# Patient Record
Sex: Male | Born: 1955 | Race: White | Hispanic: No | Marital: Single | Smoking: Current every day smoker
Health system: Southern US, Community
[De-identification: ages and names within clinical notes are randomized; demographics above are authoritative.]

## PROBLEM LIST (undated history)

## (undated) DIAGNOSIS — M1990 Unspecified osteoarthritis, unspecified site: Secondary | ICD-10-CM

## (undated) DIAGNOSIS — Z9989 Dependence on other enabling machines and devices: Secondary | ICD-10-CM

## (undated) DIAGNOSIS — G4733 Obstructive sleep apnea (adult) (pediatric): Secondary | ICD-10-CM

## (undated) DIAGNOSIS — M545 Low back pain, unspecified: Secondary | ICD-10-CM

## (undated) DIAGNOSIS — K219 Gastro-esophageal reflux disease without esophagitis: Secondary | ICD-10-CM

## (undated) DIAGNOSIS — J439 Emphysema, unspecified: Secondary | ICD-10-CM

## (undated) DIAGNOSIS — M79604 Pain in right leg: Secondary | ICD-10-CM

## (undated) DIAGNOSIS — I639 Cerebral infarction, unspecified: Secondary | ICD-10-CM

## (undated) DIAGNOSIS — I10 Essential (primary) hypertension: Secondary | ICD-10-CM

## (undated) DIAGNOSIS — F419 Anxiety disorder, unspecified: Secondary | ICD-10-CM

## (undated) DIAGNOSIS — H3530 Unspecified macular degeneration: Secondary | ICD-10-CM

## (undated) DIAGNOSIS — E1142 Type 2 diabetes mellitus with diabetic polyneuropathy: Secondary | ICD-10-CM

## (undated) DIAGNOSIS — E119 Type 2 diabetes mellitus without complications: Secondary | ICD-10-CM

## (undated) DIAGNOSIS — R519 Headache, unspecified: Secondary | ICD-10-CM

## (undated) DIAGNOSIS — R51 Headache: Secondary | ICD-10-CM

## (undated) DIAGNOSIS — M79605 Pain in left leg: Secondary | ICD-10-CM

## (undated) DIAGNOSIS — I219 Acute myocardial infarction, unspecified: Secondary | ICD-10-CM

## (undated) DIAGNOSIS — C61 Malignant neoplasm of prostate: Secondary | ICD-10-CM

## (undated) DIAGNOSIS — E7800 Pure hypercholesterolemia, unspecified: Secondary | ICD-10-CM

## (undated) DIAGNOSIS — I2510 Atherosclerotic heart disease of native coronary artery without angina pectoris: Secondary | ICD-10-CM

## (undated) DIAGNOSIS — J449 Chronic obstructive pulmonary disease, unspecified: Secondary | ICD-10-CM

## (undated) DIAGNOSIS — G8929 Other chronic pain: Secondary | ICD-10-CM

## (undated) HISTORY — PX: WISDOM TOOTH EXTRACTION: SHX21

## (undated) HISTORY — PX: FRACTURE SURGERY: SHX138

---
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
## 2009-09-27 DIAGNOSIS — I639 Cerebral infarction, unspecified: Secondary | ICD-10-CM

## 2009-09-27 HISTORY — DX: Cerebral infarction, unspecified: I63.9

## 2009-09-30 DIAGNOSIS — Z8673 Personal history of transient ischemic attack (TIA), and cerebral infarction without residual deficits: Secondary | ICD-10-CM | POA: Insufficient documentation

## 2012-03-20 ENCOUNTER — Emergency Department (HOSPITAL_COMMUNITY)
Admission: EM | Admit: 2012-03-20 | Discharge: 2012-03-21 | Disposition: A | Discharge status: Home / Self Care | Payer: No Typology Code available for payment source | Attending: Emergency Medicine | Admitting: Emergency Medicine

## 2012-03-20 ENCOUNTER — Emergency Department (HOSPITAL_COMMUNITY): Payer: No Typology Code available for payment source

## 2012-03-20 ENCOUNTER — Encounter (HOSPITAL_COMMUNITY): Payer: Self-pay | Admitting: *Deleted

## 2012-03-20 DIAGNOSIS — S8263XA Displaced fracture of lateral malleolus of unspecified fibula, initial encounter for closed fracture: Secondary | ICD-10-CM | POA: Insufficient documentation

## 2012-03-20 DIAGNOSIS — E119 Type 2 diabetes mellitus without complications: Secondary | ICD-10-CM | POA: Insufficient documentation

## 2012-03-20 DIAGNOSIS — Y929 Unspecified place or not applicable: Secondary | ICD-10-CM | POA: Insufficient documentation

## 2012-03-20 DIAGNOSIS — G8929 Other chronic pain: Secondary | ICD-10-CM | POA: Insufficient documentation

## 2012-03-20 DIAGNOSIS — Y9389 Activity, other specified: Secondary | ICD-10-CM | POA: Insufficient documentation

## 2012-03-20 DIAGNOSIS — F17200 Nicotine dependence, unspecified, uncomplicated: Secondary | ICD-10-CM | POA: Insufficient documentation

## 2012-03-20 DIAGNOSIS — G4730 Sleep apnea, unspecified: Secondary | ICD-10-CM | POA: Insufficient documentation

## 2012-03-20 DIAGNOSIS — Z8673 Personal history of transient ischemic attack (TIA), and cerebral infarction without residual deficits: Secondary | ICD-10-CM | POA: Insufficient documentation

## 2012-03-20 DIAGNOSIS — J4489 Other specified chronic obstructive pulmonary disease: Secondary | ICD-10-CM | POA: Insufficient documentation

## 2012-03-20 DIAGNOSIS — S52509A Unspecified fracture of the lower end of unspecified radius, initial encounter for closed fracture: Secondary | ICD-10-CM

## 2012-03-20 DIAGNOSIS — J449 Chronic obstructive pulmonary disease, unspecified: Secondary | ICD-10-CM | POA: Insufficient documentation

## 2012-03-20 DIAGNOSIS — M549 Dorsalgia, unspecified: Secondary | ICD-10-CM | POA: Insufficient documentation

## 2012-03-20 DIAGNOSIS — Z79899 Other long term (current) drug therapy: Secondary | ICD-10-CM | POA: Insufficient documentation

## 2012-03-20 DIAGNOSIS — S52599A Other fractures of lower end of unspecified radius, initial encounter for closed fracture: Secondary | ICD-10-CM | POA: Insufficient documentation

## 2012-03-20 HISTORY — DX: Cerebral infarction, unspecified: I63.9

## 2012-03-20 HISTORY — DX: Chronic obstructive pulmonary disease, unspecified: J44.9

## 2012-03-20 LAB — POCT I-STAT, CHEM 8
Calcium, Ion: 1.12 mmol/L (ref 1.12–1.23)
Glucose, Bld: 257 mg/dL — ABNORMAL HIGH (ref 70–99)
HCT: 43 % (ref 39.0–52.0)
Hemoglobin: 14.6 g/dL (ref 13.0–17.0)
TCO2: 19 mmol/L (ref 0–100)

## 2012-03-20 MED ORDER — IOHEXOL 300 MG/ML  SOLN
100.0000 mL | Freq: Once | INTRAMUSCULAR | Status: AC | PRN
  Administered 2012-03-20: 100 mL via INTRAVENOUS

## 2012-03-20 MED ORDER — FENTANYL CITRATE 0.05 MG/ML IJ SOLN
50.0000 ug | INTRAMUSCULAR | Status: AC | PRN
  Administered 2012-03-20 (×2): 50 ug via INTRAVENOUS
  Filled 2012-03-20 (×2): qty 2

## 2012-03-20 MED ORDER — OXYCODONE HCL 5 MG PO TABS: 5.0000 mg | ORAL_TABLET | ORAL | Status: DC | PRN

## 2012-03-20 MED ORDER — SODIUM CHLORIDE 0.9 % IV SOLN
INTRAVENOUS | Status: DC
  Administered 2012-03-20: 21:00:00 via INTRAVENOUS
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
## 2012-03-21 NOTE — ED Notes (Signed)
Pt discharged. Pt stable at time of discharge. Medications reviewed pt has no questions regarding discharge at this time. Pt voiced understanding of discharge instructions.  

## 2012-03-24 ENCOUNTER — Other Ambulatory Visit (HOSPITAL_COMMUNITY): Payer: Self-pay | Admitting: Orthopaedic Surgery

## 2012-03-24 NOTE — Patient Instructions (Addendum)
Eric Brooks  03/24/2012   Your procedure is scheduled on:   03/28/2012  Report to [facility] at  1030  AM.  Call this number if you have problems the morning of surgery: [phone]   Remember:   Do not eat food:After Midnight.  May have clear liquids:until Midnight .    Take these medicines the morning of surgery with A SIP OF WATER:  lyrica,oxycodone   Do not wear jewelry, make-up or nail polish.  Do not wear lotions, powders, or perfumes.  Do not shave 48 hours prior to surgery. Men may shave face and neck.  Do not bring valuables to the hospital.  Contacts, dentures or bridgework may not be worn into surgery.  Leave suitcase in the car. After surgery it may be brought to your room.  For patients admitted to the hospital, checkout time is 11:00 AM the day of discharge.   Patients discharged the day of surgery will not be allowed to drive home.  Name and phone number of your driver: family  Special Instructions: Shower using CHG 2 nights before surgery and the night before surgery.  If you shower the day of surgery use CHG.  Use special wash - you have one bottle of CHG for all showers.  You should use approximately 1/3 of the bottle for each shower.   Please read over the following fact sheets that you were given: Pain Booklet, Coughing and Deep Breathing, MRSA Information, Surgical Site Infection Prevention, Anesthesia Post-op Instructions and Care and Recovery After Surgery Radial Fracture You have a broken bone (fracture) of the forearm. This is the part of your arm between the elbow and your wrist. Your forearm is made up of two bones. These are the radius and ulna. Your fracture is in the radial shaft. This is the bone in your forearm located on the thumb side. A cast or splint is used to protect and keep your injured bone from moving. The cast or splint will be on generally for about 5 to 6 weeks, with individual variations. HOME CARE INSTRUCTIONS   Keep the injured  part elevated while sitting or lying down. Keep the injury above the level of your heart (the center of the chest). This will decrease swelling and pain.  Apply ice to the injury for 15 to Eric minutes, 3 to 4 times per day while awake, for 2 days. Put the ice in a plastic bag and place a towel between the bag of ice and your cast or splint.  Move your fingers to avoid stiffness and minimize swelling.  If you have a plaster or fiberglass cast:  Do not try to scratch the skin under the cast using sharp or pointed objects.  Check the skin around the cast every day. You may put lotion on any red or sore areas.  Keep your cast dry and clean.  If you have a plaster splint:  Wear the splint as directed.  You may loosen the elastic around the splint if your fingers become numb, tingle, or turn cold or blue.  Do not put pressure on any part of your cast or splint. It may break. Rest your cast only on a pillow for the first 24 hours until it is fully hardened.  Your cast or splint can be protected during bathing with a plastic bag. Do not lower the cast or splint into water.  Only take over-the-counter or prescription medicines for pain, discomfort, or fever as directed by  your caregiver. SEEK IMMEDIATE MEDICAL CARE IF:   Your cast gets damaged or breaks.  You have more severe pain or swelling than you did before getting the cast.  You have severe pain when stretching your fingers.  There is a bad smell, new stains and/or pus-like (purulent) drainage coming from under the cast.  Your fingers or hand turn pale or blue and become cold or your loose feeling. Document Released: 10/15/2005 Document Revised: 07/27/2011 Document Reviewed: 01/11/2006 [facility] Patient Information 2013 [place], [place]. PATIENT INSTRUCTIONS POST-ANESTHESIA  IMMEDIATELY FOLLOWING SURGERY:  Do not drive or operate machinery for the first twenty four hours after surgery.  Do not make any important decisions for  twenty four hours after surgery or while taking narcotic pain medications or sedatives.  If you develop intractable nausea and vomiting or a severe headache please notify your doctor immediately.  FOLLOW-UP:  Please make an appointment with your surgeon as instructed. You do not need to follow up with anesthesia unless specifically instructed to do so.  WOUND CARE INSTRUCTIONS (if applicable):  Keep a dry clean dressing on the anesthesia/puncture wound site if there is drainage.  Once the wound has quit draining you may leave it open to air.  Generally you should leave the bandage intact for twenty four hours unless there is drainage.  If the epidural site drains for more than 36-48 hours please call the anesthesia department.  QUESTIONS?:  Please feel free to call your physician or the hospital operator if you have any questions, and they will be happy to assist you.

## 2012-03-25 ENCOUNTER — Encounter (HOSPITAL_COMMUNITY)
Admission: RE | Admit: 2012-03-25 | Discharge: 2012-03-25 | Disposition: A | Discharge status: Home / Self Care | Payer: No Typology Code available for payment source | Source: Ambulatory Visit | Attending: Orthopaedic Surgery | Admitting: Orthopaedic Surgery

## 2012-03-25 ENCOUNTER — Encounter (HOSPITAL_COMMUNITY): Payer: Self-pay

## 2012-03-25 ENCOUNTER — Encounter (HOSPITAL_COMMUNITY): Payer: Self-pay | Admitting: Pharmacy Technician

## 2012-03-25 HISTORY — DX: Anxiety disorder, unspecified: F41.9

## 2012-03-25 HISTORY — DX: Pure hypercholesterolemia, unspecified: E78.00

## 2012-03-25 HISTORY — DX: Gastro-esophageal reflux disease without esophagitis: K21.9

## 2012-03-25 HISTORY — DX: Unspecified osteoarthritis, unspecified site: M19.90

## 2012-03-25 LAB — COMPREHENSIVE METABOLIC PANEL
AST: 16 U/L (ref 0–37)
Albumin: 3.6 g/dL (ref 3.5–5.2)
BUN: 13 mg/dL (ref 6–23)
Calcium: 9.2 mg/dL (ref 8.4–10.5)
Creatinine, Ser: 0.67 mg/dL (ref 0.50–1.35)
Total Protein: 7 g/dL (ref 6.0–8.3)

## 2012-03-25 LAB — URINALYSIS, ROUTINE W REFLEX MICROSCOPIC
Glucose, UA: >1000 mg/dL — AB
Hgb urine dipstick: NEGATIVE
Leukocytes, UA: NEGATIVE
Specific Gravity, Urine: >1.03 — ABNORMAL HIGH (ref 1.005–1.030)

## 2012-03-25 LAB — SURGICAL PCR SCREEN
MRSA, PCR: NEGATIVE
Staphylococcus aureus: NEGATIVE

## 2012-03-25 LAB — CBC WITH DIFFERENTIAL/PLATELET
Basophils Absolute: 0.1 10*3/uL (ref 0.0–0.1)
Basophils Relative: 1 % (ref 0–1)
Eosinophils Absolute: 0.3 10*3/uL (ref 0.0–0.7)
Eosinophils Relative: 3 % (ref 0–5)
HCT: 45 % (ref 39.0–52.0)
MCH: 31.1 pg (ref 26.0–34.0)
MCHC: 35.3 g/dL (ref 30.0–36.0)
MCV: 87.9 fL (ref 78.0–100.0)
Monocytes Absolute: 0.8 10*3/uL (ref 0.1–1.0)
Neutro Abs: 7.5 10*3/uL (ref 1.7–7.7)
RDW: 12.2 % (ref 11.5–15.5)

## 2012-03-25 LAB — URINE MICROSCOPIC-ADD ON: Urine-Other: NONE SEEN

## 2012-03-25 NOTE — Pre-Procedure Instructions (Signed)
Patient in for preop visit. States that after 5-6 hours of being NPO he typically will have a blood sugar of 70 or below. Dr Jayme Cloud notified and per his order patient was instructed that if he had an episode of hypoglycemia prior to surgery that he could drink some sort of clear juice,with no pulp like apple juice. Patient verbalized understanding of this also. Dr Jayme Cloud also wanted patient to come in earlier than regular arrival time so we could check his CBG. Patient also verbalized understanding of this.

## 2012-03-28 ENCOUNTER — Encounter (HOSPITAL_COMMUNITY): Payer: Self-pay | Admitting: Anesthesiology

## 2012-03-28 ENCOUNTER — Other Ambulatory Visit (HOSPITAL_COMMUNITY): Payer: Self-pay | Admitting: Orthopaedic Surgery

## 2012-03-28 ENCOUNTER — Ambulatory Visit (HOSPITAL_COMMUNITY): Payer: No Typology Code available for payment source | Admitting: Anesthesiology

## 2012-03-28 ENCOUNTER — Encounter (HOSPITAL_COMMUNITY)
Admission: RE | Disposition: A | Discharge status: Home / Self Care | Payer: Self-pay | Source: Ambulatory Visit | Attending: Orthopaedic Surgery

## 2012-03-28 ENCOUNTER — Encounter (HOSPITAL_COMMUNITY): Payer: Self-pay | Admitting: Orthopaedic Surgery

## 2012-03-28 ENCOUNTER — Ambulatory Visit (HOSPITAL_COMMUNITY)
Admission: RE | Admit: 2012-03-28 | Discharge: 2012-03-29 | Disposition: A | Discharge status: Home / Self Care | Payer: No Typology Code available for payment source | Source: Ambulatory Visit | Attending: Orthopaedic Surgery | Admitting: Orthopaedic Surgery

## 2012-03-28 ENCOUNTER — Ambulatory Visit (HOSPITAL_COMMUNITY): Payer: No Typology Code available for payment source

## 2012-03-28 DIAGNOSIS — J4489 Other specified chronic obstructive pulmonary disease: Secondary | ICD-10-CM | POA: Insufficient documentation

## 2012-03-28 DIAGNOSIS — Z01812 Encounter for preprocedural laboratory examination: Secondary | ICD-10-CM | POA: Insufficient documentation

## 2012-03-28 DIAGNOSIS — J449 Chronic obstructive pulmonary disease, unspecified: Secondary | ICD-10-CM | POA: Insufficient documentation

## 2012-03-28 DIAGNOSIS — S52539A Colles' fracture of unspecified radius, initial encounter for closed fracture: Secondary | ICD-10-CM | POA: Insufficient documentation

## 2012-03-28 DIAGNOSIS — S52309A Unspecified fracture of shaft of unspecified radius, initial encounter for closed fracture: Secondary | ICD-10-CM

## 2012-03-28 DIAGNOSIS — E119 Type 2 diabetes mellitus without complications: Secondary | ICD-10-CM | POA: Insufficient documentation

## 2012-03-28 HISTORY — PX: ORIF RADIAL FRACTURE: SHX5113

## 2012-03-28 LAB — GLUCOSE, CAPILLARY: Glucose-Capillary: 148 mg/dL — ABNORMAL HIGH (ref 70–99)

## 2012-03-28 SURGERY — OPEN REDUCTION INTERNAL FIXATION (ORIF) RADIAL FRACTURE
Anesthesia: General | Site: Arm Lower | Laterality: Right | Wound class: Clean

## 2012-03-28 MED ORDER — MIDAZOLAM HCL 5 MG/5ML IJ SOLN
INTRAMUSCULAR | Status: DC | PRN
  Administered 2012-03-28: 2 mg via INTRAVENOUS

## 2012-03-28 MED ORDER — SUCCINYLCHOLINE CHLORIDE 20 MG/ML IJ SOLN
INTRAMUSCULAR | Status: AC
  Filled 2012-03-28: qty 1

## 2012-03-28 MED ORDER — PREGABALIN 75 MG PO CAPS
75.0000 mg | ORAL_CAPSULE | Freq: Three times a day (TID) | ORAL | Status: DC
  Administered 2012-03-28 – 2012-03-29 (×3): 75 mg via ORAL
  Filled 2012-03-28 (×3): qty 1

## 2012-03-28 MED ORDER — FENTANYL CITRATE 0.05 MG/ML IJ SOLN
25.0000 ug | INTRAMUSCULAR | Status: DC | PRN
  Administered 2012-03-28 (×4): 50 ug via INTRAVENOUS

## 2012-03-28 MED ORDER — METFORMIN HCL 500 MG PO TABS
500.0000 mg | ORAL_TABLET | Freq: Two times a day (BID) | ORAL | Status: DC
  Administered 2012-03-28 – 2012-03-29 (×2): 500 mg via ORAL
  Filled 2012-03-28 (×2): qty 1

## 2012-03-28 MED ORDER — FENTANYL CITRATE 0.05 MG/ML IJ SOLN
INTRAMUSCULAR | Status: AC
  Filled 2012-03-28: qty 2

## 2012-03-28 MED ORDER — CEFAZOLIN SODIUM-DEXTROSE 2-3 GM-% IV SOLR
INTRAVENOUS | Status: AC
  Filled 2012-03-28: qty 50

## 2012-03-28 MED ORDER — PROPOFOL 10 MG/ML IV EMUL
INTRAVENOUS | Status: DC | PRN
  Administered 2012-03-28: 200 mg via INTRAVENOUS

## 2012-03-28 MED ORDER — SODIUM CHLORIDE 0.9 % IJ SOLN: 9.0000 mL | INTRAMUSCULAR | Status: DC | PRN

## 2012-03-28 MED ORDER — LACTATED RINGERS IV SOLN
INTRAVENOUS | Status: DC | PRN
  Administered 2012-03-28 (×2): via INTRAVENOUS

## 2012-03-28 MED ORDER — METOPROLOL TARTRATE 1 MG/ML IV SOLN
INTRAVENOUS | Status: DC | PRN
  Administered 2012-03-28 (×5): 1 mg via INTRAVENOUS

## 2012-03-28 MED ORDER — MORPHINE SULFATE (PF) 1 MG/ML IV SOLN
INTRAVENOUS | Status: DC
  Administered 2012-03-28: 19:00:00 via INTRAVENOUS
  Administered 2012-03-28: 18.55 mg via INTRAVENOUS
  Administered 2012-03-28 (×2): via INTRAVENOUS
  Administered 2012-03-29: 22.5 mg via INTRAVENOUS
  Administered 2012-03-29: 03:00:00 via INTRAVENOUS
  Filled 2012-03-28 (×4): qty 25

## 2012-03-28 MED ORDER — PROPOFOL 10 MG/ML IV EMUL
INTRAVENOUS | Status: AC
  Filled 2012-03-28: qty 20

## 2012-03-28 MED ORDER — NALOXONE HCL 0.4 MG/ML IJ SOLN: 0.4000 mg | INTRAMUSCULAR | Status: DC | PRN

## 2012-03-28 MED ORDER — ONDANSETRON HCL 4 MG/2ML IJ SOLN
4.0000 mg | Freq: Once | INTRAMUSCULAR | Status: AC
  Administered 2012-03-28: 4 mg via INTRAVENOUS

## 2012-03-28 MED ORDER — ALBUTEROL SULFATE (5 MG/ML) 0.5% IN NEBU
2.5000 mg | INHALATION_SOLUTION | Freq: Four times a day (QID) | RESPIRATORY_TRACT | Status: DC
  Administered 2012-03-28 – 2012-03-29 (×3): 2.5 mg via RESPIRATORY_TRACT
  Filled 2012-03-28 (×3): qty 0.5

## 2012-03-28 MED ORDER — OXYCODONE HCL 5 MG PO TABS: 5.0000 mg | ORAL_TABLET | ORAL | Status: DC | PRN

## 2012-03-28 MED ORDER — NEOSTIGMINE METHYLSULFATE 1 MG/ML IJ SOLN
INTRAMUSCULAR | Status: AC
  Filled 2012-03-28: qty 10

## 2012-03-28 MED ORDER — NEOSTIGMINE METHYLSULFATE 1 MG/ML IJ SOLN
INTRAMUSCULAR | Status: DC | PRN
  Administered 2012-03-28: 4 mg via INTRAVENOUS

## 2012-03-28 MED ORDER — ONDANSETRON HCL 4 MG/2ML IJ SOLN: 4.0000 mg | Freq: Once | INTRAMUSCULAR | Status: DC | PRN

## 2012-03-28 MED ORDER — LACTATED RINGERS IV SOLN
INTRAVENOUS | Status: DC
  Administered 2012-03-28: 12:00:00 via INTRAVENOUS

## 2012-03-28 MED ORDER — GLYCOPYRROLATE 0.2 MG/ML IJ SOLN
INTRAMUSCULAR | Status: AC
  Filled 2012-03-28: qty 3

## 2012-03-28 MED ORDER — MIDAZOLAM HCL 2 MG/2ML IJ SOLN
1.0000 mg | INTRAMUSCULAR | Status: DC | PRN
  Administered 2012-03-28: 2 mg via INTRAVENOUS

## 2012-03-28 MED ORDER — DIPHENHYDRAMINE HCL 50 MG/ML IJ SOLN: 12.5000 mg | Freq: Four times a day (QID) | INTRAMUSCULAR | Status: DC | PRN

## 2012-03-28 MED ORDER — TIOTROPIUM BROMIDE MONOHYDRATE 18 MCG IN CAPS
18.0000 ug | ORAL_CAPSULE | Freq: Every day | RESPIRATORY_TRACT | Status: DC
  Administered 2012-03-29: 18 ug via RESPIRATORY_TRACT
  Filled 2012-03-28: qty 5

## 2012-03-28 MED ORDER — FENTANYL CITRATE 0.05 MG/ML IJ SOLN
INTRAMUSCULAR | Status: AC
  Filled 2012-03-28: qty 4

## 2012-03-28 MED ORDER — LIDOCAINE HCL (CARDIAC) 10 MG/ML IV SOLN
INTRAVENOUS | Status: DC | PRN
  Administered 2012-03-28: 50 mg via INTRAVENOUS

## 2012-03-28 MED ORDER — DIPHENHYDRAMINE HCL 12.5 MG/5ML PO ELIX: 12.5000 mg | ORAL_SOLUTION | Freq: Four times a day (QID) | ORAL | Status: DC | PRN

## 2012-03-28 MED ORDER — ROCURONIUM BROMIDE 50 MG/5ML IV SOLN
INTRAVENOUS | Status: AC
  Filled 2012-03-28: qty 1

## 2012-03-28 MED ORDER — MIDAZOLAM HCL 2 MG/2ML IJ SOLN
INTRAMUSCULAR | Status: AC
  Filled 2012-03-28: qty 2

## 2012-03-28 MED ORDER — 0.9 % SODIUM CHLORIDE (POUR BTL) OPTIME
TOPICAL | Status: DC | PRN
  Administered 2012-03-28: 1000 mL

## 2012-03-28 MED ORDER — ONDANSETRON HCL 4 MG/2ML IJ SOLN: 4.0000 mg | Freq: Four times a day (QID) | INTRAMUSCULAR | Status: DC | PRN

## 2012-03-28 MED ORDER — GLYCOPYRROLATE 0.2 MG/ML IJ SOLN
INTRAMUSCULAR | Status: DC | PRN
  Administered 2012-03-28: 0.6 mg via INTRAVENOUS

## 2012-03-28 MED ORDER — ONDANSETRON HCL 4 MG/2ML IJ SOLN
INTRAMUSCULAR | Status: AC
  Filled 2012-03-28: qty 2

## 2012-03-28 MED ORDER — SIMVASTATIN 20 MG PO TABS
20.0000 mg | ORAL_TABLET | Freq: Every evening | ORAL | Status: DC
  Administered 2012-03-28: 20 mg via ORAL
  Filled 2012-03-28: qty 1

## 2012-03-28 MED ORDER — SUCCINYLCHOLINE CHLORIDE 20 MG/ML IJ SOLN
INTRAMUSCULAR | Status: DC | PRN
  Administered 2012-03-28: 120 mg via INTRAVENOUS

## 2012-03-28 MED ORDER — CEFAZOLIN SODIUM-DEXTROSE 2-3 GM-% IV SOLR: 2.0000 g | INTRAVENOUS | Status: DC

## 2012-03-28 MED ORDER — CEFAZOLIN SODIUM-DEXTROSE 2-3 GM-% IV SOLR
INTRAVENOUS | Status: DC | PRN
  Administered 2012-03-28: 2 g via INTRAVENOUS

## 2012-03-28 MED ORDER — GLYCOPYRROLATE 0.2 MG/ML IJ SOLN
INTRAMUSCULAR | Status: AC
  Filled 2012-03-28: qty 1

## 2012-03-28 MED ORDER — FENTANYL CITRATE 0.05 MG/ML IJ SOLN
INTRAMUSCULAR | Status: DC | PRN
  Administered 2012-03-28 (×2): 50 ug via INTRAVENOUS
  Administered 2012-03-28 (×2): 100 ug via INTRAVENOUS
  Administered 2012-03-28 (×2): 50 ug via INTRAVENOUS

## 2012-03-28 MED ORDER — ROCURONIUM BROMIDE 100 MG/10ML IV SOLN
INTRAVENOUS | Status: DC | PRN
  Administered 2012-03-28 (×2): 10 mg via INTRAVENOUS

## 2012-03-28 MED ORDER — GLYCOPYRROLATE 0.2 MG/ML IJ SOLN
0.2000 mg | Freq: Once | INTRAMUSCULAR | Status: AC
  Administered 2012-03-28: 0.2 mg via INTRAVENOUS

## 2012-03-28 MED ORDER — SODIUM CHLORIDE 0.45 % IV SOLN
INTRAVENOUS | Status: DC
  Administered 2012-03-28: 16:00:00 via INTRAVENOUS

## 2012-03-28 MED ORDER — LIDOCAINE HCL (PF) 1 % IJ SOLN
INTRAMUSCULAR | Status: AC
  Filled 2012-03-28: qty 5

## 2012-03-28 MED ORDER — PROMETHAZINE HCL 25 MG/ML IJ SOLN: 12.5000 mg | INTRAMUSCULAR | Status: DC | PRN

## 2012-03-28 MED ORDER — METOPROLOL TARTRATE 1 MG/ML IV SOLN
INTRAVENOUS | Status: AC
  Filled 2012-03-28: qty 5

## 2012-03-28 SURGICAL SUPPLY — 51 items
BAG HAMPER (MISCELLANEOUS) ×2 IMPLANT
BANDAGE ELASTIC 4 VELCRO NS (GAUZE/BANDAGES/DRESSINGS) ×4 IMPLANT
BANDAGE ESMARK 4X12 BL STRL LF (DISPOSABLE) ×1 IMPLANT
BIT DRILL 2.5X110 QC LCP DISP (BIT) ×2 IMPLANT
BNDG COHESIVE 4X5 TAN NS LF (GAUZE/BANDAGES/DRESSINGS) ×2 IMPLANT
BNDG ESMARK 4X12 BLUE STRL LF (DISPOSABLE) ×2
CLOTH BEACON ORANGE TIMEOUT ST (SAFETY) ×2 IMPLANT
COVER LIGHT HANDLE STERIS (MISCELLANEOUS) ×4 IMPLANT
CUFF TOURNIQUET SINGLE 18IN (TOURNIQUET CUFF) IMPLANT
CUFF TOURNIQUET SINGLE 24IN (TOURNIQUET CUFF) ×2 IMPLANT
DRAPE C-ARM FOLDED MOBILE STRL (DRAPES) ×2 IMPLANT
DURAPREP 26ML APPLICATOR (WOUND CARE) ×4 IMPLANT
GAUZE XEROFORM 5X9 LF (GAUZE/BANDAGES/DRESSINGS) ×2 IMPLANT
GLOVE BIO SURGEON STRL SZ8 (GLOVE) ×2 IMPLANT
GLOVE BIO SURGEON STRL SZ8.5 (GLOVE) ×2 IMPLANT
GLOVE BIOGEL PI IND STRL 7.0 (GLOVE) ×2 IMPLANT
GLOVE BIOGEL PI INDICATOR 7.0 (GLOVE) ×2
GLOVE ECLIPSE 6.5 STRL STRAW (GLOVE) ×4 IMPLANT
GLOVE EXAM NITRILE MD LF STRL (GLOVE) ×2 IMPLANT
GOWN STRL REIN XL XLG (GOWN DISPOSABLE) ×6 IMPLANT
GUIDEWIRE ORTH 6X062XTROC NS (WIRE) IMPLANT
K-WIRE .062 (WIRE)
K-WIRE 5 THRD TROCAR 1.6X150 (WIRE) ×2
KIT ROOM TURNOVER APOR (KITS) ×2 IMPLANT
KWIRE 5 THRD TROCAR 1.6X150 (WIRE) ×1 IMPLANT
MANIFOLD NEPTUNE II (INSTRUMENTS) ×2 IMPLANT
NS IRRIG 1000ML POUR BTL (IV SOLUTION) ×2 IMPLANT
PACK BASIC LIMB (CUSTOM PROCEDURE TRAY) ×2 IMPLANT
PAD ABD 5X9 TENDERSORB (GAUZE/BANDAGES/DRESSINGS) ×4 IMPLANT
PAD CAST 4YDX4 CTTN HI CHSV (CAST SUPPLIES) ×1 IMPLANT
PADDING CAST COTTON 4X4 STRL (CAST SUPPLIES) ×1
PROS LCP PLATE 5H 72MM (Plate) ×2 IMPLANT
PROSTHESIS LCP PLATE 5H 72MM (Plate) ×1 IMPLANT
PUTTY DBX 1CC (Putty) ×2 IMPLANT
PUTTY DBX 1CC DEPUY (Putty) ×1 IMPLANT
SCREW CORTEX 3.5 18MM (Screw) ×4 IMPLANT
SCREW CORTEX 3.5 20MM (Screw) ×1 IMPLANT
SCREW LOCK CORT ST 3.5X18 (Screw) ×4 IMPLANT
SCREW LOCK CORT ST 3.5X20 (Screw) ×1 IMPLANT
SET BASIN LINEN APH (SET/KITS/TRAYS/PACK) ×2 IMPLANT
SOL PREP PROV IODINE SCRUB 4OZ (MISCELLANEOUS) IMPLANT
SPLINT IMMOBILIZER J 3INX20FT (CAST SUPPLIES) ×2
SPLINT J IMMOBILIZER 3X20FT (CAST SUPPLIES) ×2 IMPLANT
SPONGE GAUZE 4X4 12PLY (GAUZE/BANDAGES/DRESSINGS) ×2 IMPLANT
SPONGE LAP 18X18 X RAY DECT (DISPOSABLE) ×2 IMPLANT
STAPLER VISISTAT 35W (STAPLE) ×2 IMPLANT
SUT CHROMIC 2 0 CT 1 (SUTURE) ×2 IMPLANT
SUT ETHILON 3 0 FSL (SUTURE) IMPLANT
SUT PLAIN CT 1/2CIR 2-0 27IN (SUTURE) ×2 IMPLANT
SYR BULB IRRIGATION 50ML (SYRINGE) ×2 IMPLANT
TOWEL OR 17X26 4PK STRL BLUE (TOWEL DISPOSABLE) ×2 IMPLANT

## 2012-03-28 NOTE — Anesthesia Preprocedure Evaluation (Signed)
Anesthesia Evaluation  Patient identified by MRN, date of birth, ID band Patient awake    Reviewed: Allergy & Precautions, H&P , NPO status , Patient's Chart, lab work & pertinent test results  Airway Mallampati: III TM Distance: >3 FB     Dental  (+) Teeth Intact and Partial Upper   Pulmonary sleep apnea and Continuous Positive Airway Pressure Ventilation , COPDCurrent Smoker,  breath sounds clear to auscultation        Cardiovascular negative cardio ROS  Rhythm:Regular Rate:Normal     Neuro/Psych Anxiety CVA, Residual Symptoms    GI/Hepatic GERD-  Medicated and Controlled,  Endo/Other  diabetes, Well Controlled, Type 2, Oral Hypoglycemic AgentsMorbid obesity  Renal/GU      Musculoskeletal   Abdominal   Peds  Hematology   Anesthesia Other Findings   Reproductive/Obstetrics                           Anesthesia Physical Anesthesia Plan  ASA: III  Anesthesia Plan: General   Post-op Pain Management:    Induction: Intravenous, Rapid sequence and Cricoid pressure planned  Airway Management Planned: Oral ETT  Additional Equipment:   Intra-op Plan:   Post-operative Plan: Extubation in OR  Informed Consent: I have reviewed the patients History and Physical, chart, labs and discussed the procedure including the risks, benefits and alternatives for the proposed anesthesia with the patient or authorized representative who has indicated his/her understanding and acceptance.     Plan Discussed with:   Anesthesia Plan Comments:         Anesthesia Quick Evaluation

## 2012-03-28 NOTE — Preoperative (Signed)
Beta Blockers   Reason not to administer Beta Blockers:Not Applicable 

## 2012-03-28 NOTE — Anesthesia Postprocedure Evaluation (Signed)
  Anesthesia Post-op Note  Patient: Eric Brooks  Procedure(s) Performed: Procedure(s) (LRB) with comments: OPEN REDUCTION INTERNAL FIXATION (ORIF) RADIAL FRACTURE (Right) - ORIF Right Radial Shaft Fracture  Patient Location: PACU  Anesthesia Type: General  Level of Consciousness: awake, alert , oriented and patient cooperative  Airway and Oxygen Therapy: Patient Spontanous Breathing and Patient connected to face mask oxygen  Post-op Pain: mild  Post-op Assessment: Post-op Vital signs reviewed, Patient's Cardiovascular Status Stable, Respiratory Function Stable, Patent Airway and No signs of Nausea or vomiting  Post-op Vital Signs: Reviewed and stable  Complications: No apparent anesthesia complications  

## 2012-03-28 NOTE — Transfer of Care (Signed)
  Anesthesia Post-op Note  Patient: Eric Brooks  Procedure(s) Performed: Procedure(s) (LRB) with comments: OPEN REDUCTION INTERNAL FIXATION (ORIF) RADIAL FRACTURE (Right) - ORIF Right Radial Shaft Fracture  Patient Location: PACU  Anesthesia Type: General  Level of Consciousness: awake, alert , oriented and patient cooperative  Airway and Oxygen Therapy: Patient Spontanous Breathing and Patient connected to face mask oxygen  Post-op Pain: mild  Post-op Assessment: Post-op Vital signs reviewed, Patient's Cardiovascular Status Stable, Respiratory Function Stable, Patent Airway and No signs of Nausea or vomiting  Post-op Vital Signs: Reviewed and stable  Complications: No apparent anesthesia complications
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
## 2012-03-28 NOTE — Anesthesia Procedure Notes (Signed)
Procedure Name: Intubation Date/Time: 03/28/2012 11:59 AM Performed by: Carolyne Littles, AMY L Pre-anesthesia Checklist: Patient identified, Patient being monitored, Timeout performed, Emergency Drugs available and Suction available Patient Re-evaluated:Patient Re-evaluated prior to inductionOxygen Delivery Method: Circle System Utilized Preoxygenation: Pre-oxygenation with 100% oxygen Intubation Type: IV induction Ventilation: Mask ventilation without difficulty Laryngoscope Size: 3 and Miller Grade View: Grade I Tube type: Oral Tube size: 8.0 mm Number of attempts: 1 Airway Equipment and Method: stylet Placement Confirmation: ETT inserted through vocal cords under direct vision,  positive ETCO2 and breath sounds checked- equal and bilateral Secured at: 21 cm Tube secured with: Tape Dental Injury: Teeth and Oropharynx as per pre-operative assessment

## 2012-03-29 NOTE — Addendum Note (Signed)
Addendum  created 03/29/12 1217 by Franco Nones, CRNA   Modules edited:Notes Section
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
## 2012-03-29 NOTE — Anesthesia Postprocedure Evaluation (Signed)
Anesthesia Post Note  Patient: Eric Brooks  Procedure(s) Performed: Procedure(s) (LRB): OPEN REDUCTION INTERNAL FIXATION (ORIF) RADIAL FRACTURE (Right)  Anesthesia type: General  Patient location: 320  Post pain: Pain level controlled  Post assessment: Post-op Vital signs reviewed, Patient's Cardiovascular Status Stable, Respiratory Function Stable, Patent Airway, No signs of Nausea or vomiting and Pain level controlled  Last Vitals:  Filed Vitals:   03/29/12 0900  BP: 118/54  Pulse: 93  Temp: 36.7 C  Resp: 16    Post vital signs: Reviewed and stable  Level of consciousness: awake and alert   Complications: No apparent anesthesia complications

## 2012-03-29 NOTE — Progress Notes (Signed)
Discharge instructions given to pt. With teach back given to RN. Pt. Taken to car via w/c. 

## 2012-03-29 NOTE — Progress Notes (Signed)
Upon doing pt 0200 breathing treatment pt informed me that he wears a CPAP machine at night and that's the reason why he wasn't able to sleep. He states he had mentioned this to a several people about this and it wasn't past along to get an order for it. Pt placed on auto titrate machine and tolerate well.

## 2012-03-29 NOTE — Discharge Summary (Signed)
Physician Discharge Summary  Patient ID: Eric Brooks MRN: [id number] DOB/AGE: [birth year] 56 y.o.  Admit date: 03/28/2012 Discharge date: 03/29/2012  Admission Diagnoses: Fracture distal radius diaphysis shaft right  Discharge Diagnoses: Fracture distal radius diaphysis shaft right Active Problems:  * No active hospital problems. *    Discharged Condition: good  Hospital Course: He was admitted post surgery on the right distal diaphysis fracture of the radius for open reduction and internal fixation.  His pain was controlled on PCA morphine.  Neurovascular has been maintained.  He is ready to go home, he is afebrile.  He will resume the pain medicine he has at home.    Consults: None  Significant Diagnostic Studies: radiology: X-Ray: of the right distal radius shows reduction with five hole plate and screws of the distal radius fracture.  Treatments: surgery: open treatment and internal fixation of the distal right radius fracture.  Discharge Exam: Blood pressure 127/66, pulse 92, temperature 97.8 F (36.6 C), temperature source Oral, resp. rate 14, height 5\' 7"  (1.702 m), weight 110 kg (242 lb 8.1 oz), SpO2 96.00%. He is afebrile.  NV is intact.  His pain is controlled. His right sugar tong splint is in good condition.  Disposition: 01-Home or Self Care  Discharge Orders    Future Orders Please Complete By Expires   Diet - low sodium heart healthy      Call MD / Call 911      Comments:   If you experience chest pain or shortness of breath, CALL 911 and be transported to the hospital emergency room.  If you develope a fever above 101 F, pus (white drainage) or increased drainage or redness at the wound, or calf pain, call your surgeon's office.   Constipation Prevention      Comments:   Drink plenty of fluids.  Prune juice may be helpful.  You may use a stool softener, such as Colace (over the counter) 100 mg twice a day.  Use MiraLax (over the counter) for constipation as  needed.   Increase activity slowly as tolerated      Discharge instructions      Comments:   Keep splint on the right arm.  Keep dry.    Move fingers often.  See Dr. Hilda Lias 04/12/12 at 2:40 pm.  Call [phone] if any problem.  If after hours, call hospital [phone]       Medication List     As of 03/29/2012  8:57 AM    TAKE these medications         metFORMIN 500 MG tablet   Commonly known as: GLUCOPHAGE   Take 500 mg by mouth 2 (two) times daily with a meal.      oxyCODONE 5 MG immediate release tablet   Commonly known as: Oxy IR/ROXICODONE   Take 1 tablet (5 mg total) by mouth every 4 (four) hours as needed for pain.      pregabalin 75 MG capsule   Commonly known as: LYRICA   Take 75 mg by mouth 3 (three) times daily.      simvastatin 20 MG tablet   Commonly known as: ZOCOR   Take 20 mg by mouth every evening.      tiotropium 18 MCG inhalation capsule   Commonly known as: SPIRIVA   Place 18 mcg into inhaler and inhale daily.           Follow-up Information    Follow up with Darreld Mclean, MD. In 2 weeks. (2:40  pm at the office)    Contact information:   [address] [place] [address] [phone]          Signed: Darreld Mclean 03/29/2012, 8:57 AM
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
## 2012-03-29 NOTE — Progress Notes (Signed)
Subjective: 1 Day Post-Op Procedure(s) (LRB): OPEN REDUCTION INTERNAL FIXATION (ORIF) RADIAL FRACTURE (Right) Patient reports pain as 3 on 0-10 scale.    Objective: Vital signs in last 24 hours: Temp:  [97.3 F (36.3 C)-98.6 F (37 C)] 97.8 F (36.6 C) (11/12 1610) Pulse Rate:  [60-106] 92  (11/12 0632) Resp:  [11-24] 14  (11/12 0743) BP: (106-147)/(60-93) 127/66 mmHg (11/12 0632) SpO2:  [93 %-99 %] 96 % (11/12 0745) Weight:  [110 kg (242 lb 8.1 oz)] 110 kg (242 lb 8.1 oz) (11/11 1455)  Intake/Output from previous day: 11/11 0701 - 11/12 0700 In: 1720 [P.O.:420; I.V.:1300] Out: 775 [Urine:775] Intake/Output this shift:    No results found for this basename: HGB:5 in the last 72 hours No results found for this basename: WBC:2,RBC:2,HCT:2,PLT:2 in the last 72 hours No results found for this basename: NA:2,K:2,CL:2,CO2:2,BUN:2,CREATININE:2,GLUCOSE:2,CALCIUM:2 in the last 72 hours No results found for this basename: LABPT:2,INR:2 in the last 72 hours  Neurologically intact Neurovascular intact Sensation intact distally Intact pulses distally Dorsiflexion/Plantar flexion intact  His pain is controlled.  Will discharge home today.  Assessment/Plan: 1 Day Post-Op Procedure(s) (LRB): OPEN REDUCTION INTERNAL FIXATION (ORIF) RADIAL FRACTURE (Right) Discharge home.  I will see in office in two weeks.  Andrea Colglazier 03/29/2012, 8:52 AM

## 2012-03-31 ENCOUNTER — Encounter (HOSPITAL_COMMUNITY): Payer: Self-pay | Admitting: Orthopaedic Surgery

## 2012-05-19 NOTE — Progress Notes (Signed)
UR Chart Review Completed  

## 2013-06-29 ENCOUNTER — Ambulatory Visit: Payer: Self-pay

## 2013-08-02 ENCOUNTER — Ambulatory Visit: Payer: Self-pay | Admitting: Neurology

## 2013-08-28 ENCOUNTER — Ambulatory Visit: Payer: Self-pay | Admitting: Neurology

## 2013-08-31 DIAGNOSIS — G4733 Obstructive sleep apnea (adult) (pediatric): Secondary | ICD-10-CM | POA: Insufficient documentation

## 2013-08-31 DIAGNOSIS — H359 Unspecified retinal disorder: Secondary | ICD-10-CM | POA: Insufficient documentation

## 2013-08-31 DIAGNOSIS — M722 Plantar fascial fibromatosis: Secondary | ICD-10-CM | POA: Insufficient documentation

## 2013-08-31 DIAGNOSIS — G2581 Restless legs syndrome: Secondary | ICD-10-CM | POA: Insufficient documentation

## 2013-10-13 ENCOUNTER — Emergency Department: Payer: Self-pay | Admitting: Emergency Medicine

## 2013-11-24 DIAGNOSIS — G8929 Other chronic pain: Secondary | ICD-10-CM | POA: Insufficient documentation

## 2015-05-23 DIAGNOSIS — Z1211 Encounter for screening for malignant neoplasm of colon: Secondary | ICD-10-CM | POA: Insufficient documentation

## 2016-08-14 DIAGNOSIS — Z2821 Immunization not carried out because of patient refusal: Secondary | ICD-10-CM | POA: Insufficient documentation

## 2017-09-15 DIAGNOSIS — I219 Acute myocardial infarction, unspecified: Secondary | ICD-10-CM

## 2017-09-15 HISTORY — PX: CORONARY ANGIOPLASTY WITH STENT PLACEMENT: SHX49

## 2017-09-15 HISTORY — DX: Acute myocardial infarction, unspecified: I21.9

## 2017-10-04 ENCOUNTER — Telehealth: Payer: Self-pay | Admitting: Cardiovascular Disease

## 2017-10-04 NOTE — Telephone Encounter (Signed)
Received Notes from [facility] [place] on 10/04/17, Appt 10/20/17 @ 2:30 AM. NV

## 2017-10-15 ENCOUNTER — Encounter: Payer: Self-pay | Admitting: *Deleted

## 2017-10-20 ENCOUNTER — Encounter: Payer: Self-pay | Admitting: Cardiovascular Disease

## 2017-10-20 ENCOUNTER — Ambulatory Visit: Payer: Medicaid Other | Admitting: Cardiovascular Disease

## 2017-10-20 DIAGNOSIS — I10 Essential (primary) hypertension: Secondary | ICD-10-CM | POA: Diagnosis not present

## 2017-10-20 DIAGNOSIS — Z720 Tobacco use: Secondary | ICD-10-CM

## 2017-10-20 DIAGNOSIS — E7800 Pure hypercholesterolemia, unspecified: Secondary | ICD-10-CM

## 2017-10-20 DIAGNOSIS — I739 Peripheral vascular disease, unspecified: Secondary | ICD-10-CM

## 2017-10-20 DIAGNOSIS — E118 Type 2 diabetes mellitus with unspecified complications: Secondary | ICD-10-CM | POA: Diagnosis not present

## 2017-10-20 DIAGNOSIS — I2510 Atherosclerotic heart disease of native coronary artery without angina pectoris: Secondary | ICD-10-CM | POA: Diagnosis not present

## 2017-10-20 DIAGNOSIS — J449 Chronic obstructive pulmonary disease, unspecified: Secondary | ICD-10-CM | POA: Insufficient documentation

## 2017-10-20 DIAGNOSIS — E785 Hyperlipidemia, unspecified: Secondary | ICD-10-CM | POA: Insufficient documentation

## 2017-10-20 NOTE — Progress Notes (Signed)
10/20/2017 Eric Brooks   1956/01/04  [id number]  Primary Physician Barry Dienes, NP Primary Cardiologist: Lorretta Harp MD Garret Reddish, [place], [place]  HPI:  Eric Brooks is a 62 y.o. moderately overweight single Caucasian male with no children accompanied by his PEG in [place] today.  He was referred by Barry Dienes PAc to be established in our cardiovascular practice because of known disease.  He has a history of COPD with ongoing tobacco abuse and 52 pack years as well as treated diabetes, hypertension and hyperlipidemia.  He does have obstructive sleep apnea on CPAP.  He had 3 mini strokes in the past.  He does complain of claudication right greater than left and is a Dopplers that reveal a right ABI of 0.5 and a left of 1.  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  Other vessels were not intervened on.  He said no recurrent symptoms.   Current Meds  Medication Sig  . aspirin EC 81 MG tablet Take 1 tablet by mouth daily.  Marland Kitchen BRILINTA 90 MG TABS tablet Take 1 tablet by mouth 2 (two) times daily.  . carvedilol (COREG) 6.25 MG tablet Take 1 tablet by mouth 2 (two) times daily.  Marland Kitchen lisinopril (PRINIVIL,ZESTRIL) 10 MG tablet Take 1 tablet by mouth daily.  . metFORMIN (GLUCOPHAGE) 500 MG tablet Take 500 mg by mouth 2 (two) times daily with a meal.  . pregabalin (LYRICA) 75 MG capsule Take 75 mg by mouth 3 (three) times daily.   Marland Kitchen PROAIR HFA 108 (90 Base) MCG/ACT inhaler   . rosuvastatin (CRESTOR) 5 MG tablet Take 1 tablet by mouth daily.  Marland Kitchen tiZANidine (ZANAFLEX) 4 MG capsule Take 4 mg by mouth 3 (three) times daily.     Allergies  Allergen Reactions  . Amitriptyline Anaphylaxis    Pt reports elavil allergy  . Shellfish Allergy Anaphylaxis  . Tylenol [Acetaminophen] Shortness Of Breath and Rash  . Aspirin     Social History   Socioeconomic History  . Marital  status: Single    Spouse name: Not on file  . Number of children: Not on file  . Years of education: Not on file  . Highest education level: Not on file  Occupational History  . Not on file  Social Needs  . Financial resource strain: Not on file  . Food insecurity:    Worry: Not on file    Inability: Not on file  . Transportation needs:    Medical: Not on file    Non-medical: Not on file  Tobacco Use  . Smoking status: Current Every Day Smoker    Packs/day: 0.25    Years: 45.00    Pack years: 11.25    Types: Cigarettes  Substance and Sexual Activity  . Alcohol use: No  . Drug use: Not on file  . Sexual activity: Yes    Birth control/protection: None  Lifestyle  . Physical activity:    Days per week: Not on file    Minutes per session: Not on file  . Stress: Not on file  Relationships  . Social connections:    Talks on phone: Not on file    Gets together: Not on file    Attends religious service: Not on file    Active member of club or organization: Not on file    Attends meetings of clubs or organizations: Not  on file    Relationship status: Not on file  . Intimate partner violence:    Fear of current or ex partner: Not on file    Emotionally abused: Not on file    Physically abused: Not on file    Forced sexual activity: Not on file  Other Topics Concern  . Not on file  Social History Narrative  . Not on file     Review of Systems: General: negative for chills, fever, night sweats or weight changes.  Cardiovascular: negative for chest pain, dyspnea on exertion, edema, orthopnea, palpitations, paroxysmal nocturnal dyspnea or shortness of breath Dermatological: negative for rash Respiratory: negative for cough or wheezing Urologic: negative for hematuria Abdominal: negative for nausea, vomiting, diarrhea, bright red blood per rectum, melena, or hematemesis Neurologic: negative for visual changes, syncope, or dizziness All other systems reviewed and are  otherwise negative except as noted above.    Blood pressure 126/80, pulse 80, height 5\' 9"  (1.753 m), weight 218 lb (98.9 kg).  General appearance: alert and no distress Neck: no adenopathy, no carotid bruit, no JVD, supple, symmetrical, trachea midline and thyroid not enlarged, symmetric, no tenderness/mass/nodules Lungs: clear to auscultation bilaterally Heart: regular rate and rhythm, S1, S2 normal, no murmur, click, rub or gallop Extremities: extremities normal, atraumatic, no cyanosis or edema Pulses: Diminished pedal pulses on the right Skin: Skin color, texture, turgor normal. No rashes or lesions Neurologic: Alert and oriented X 3, normal strength and tone. Normal symmetric reflexes. Normal coordination and gait  EKG sinus rhythm 80 with early R wave transition and inferior Q waves suggesting an old inferior infarct.  There was T wave inversion in the inferior leads as well.  I personally reviewed this EKG  ASSESSMENT AND PLAN:   Tobacco abuse History of ongoing tobacco abuse 1 pack/day for the last 52 years recalcitrant risk factor modification.  Hyperlipidemia History of hyperlipidemia on statin therapy profile performed 09/30/2017 revealing total cholesterol 135, LDL 75 HDL of 34.  Essential hypertension History of essential hypertension with blood pressure measured at 126/80.  He is on carvedilol and lisinopril.  Continue current meds at current dosing.  Peripheral arterial disease (HCC) History of peripheral arterial disease with symptomatic claudication and recent segmental pressures performed 10/18/2017 revealing a right ABI 0.59 and a left of 1.02.  He does complain of lifestyle limiting claudication right greater than left.  We will obtain lower extremity arterial Doppler studies.  Coronary artery disease History of CAD status post inferior MI 09/15/2017 in [place] treated with overlapping drug-eluting stents to his RCA.  His other vessels apparently were not intervened  on.  He he is on aspirin and Brilinta.  He said no recurrent symptoms.      Lorretta Harp MD FACP,FACC,FAHA, [place] 10/20/2017 2:10 PM

## 2017-10-20 NOTE — Patient Instructions (Signed)
Medication Instructions: Your physician recommends that you continue on your current medications as directed. Please refer to the Current Medication list given to you today.  Testing/Procedures: Your physician has requested that you have an echocardiogram. Echocardiography is a painless test that uses sound waves to create images of your heart. It provides your doctor with information about the size and shape of your heart and how well your heart's chambers and valves are working. This procedure takes approximately one hour. There are no restrictions for this procedure.  Your physician has requested that you have a lower extremity arterial duplex. During this test, ultrasound is used to evaluate arterial blood flow in the legs. Allow one hour for this exam. There are no restrictions or special instructions.  Your physician has requested that you have an ankle brachial index (ABI). During this test an ultrasound and blood pressure cuff are used to evaluate the arteries that supply the arms and legs with blood. Allow thirty minutes for this exam. There are no restrictions or special instructions.  Follow-Up: Your physician recommends that you schedule a follow-up appointment in: 3 months with Dr. Gwenlyn Found.

## 2017-10-20 NOTE — Assessment & Plan Note (Signed)
History of hyperlipidemia on statin therapy profile performed 09/30/2017 revealing total cholesterol 135, LDL 75 HDL of 34.

## 2017-10-20 NOTE — Assessment & Plan Note (Signed)
History of ongoing tobacco abuse 1 pack/day for the last 52 years recalcitrant risk factor modification.

## 2017-10-20 NOTE — Assessment & Plan Note (Signed)
History of essential hypertension with blood pressure measured at 126/80.  He is on carvedilol and lisinopril.  Continue current meds at current dosing.

## 2017-10-20 NOTE — Assessment & Plan Note (Signed)
History of peripheral arterial disease with symptomatic claudication and recent segmental pressures performed 10/18/2017 revealing a right ABI 0.59 and a left of 1.02.  He does complain of lifestyle limiting claudication right greater than left.  We will obtain lower extremity arterial Doppler studies.

## 2017-10-20 NOTE — Assessment & Plan Note (Signed)
History of CAD status post inferior MI 09/15/2017 in [place] treated with overlapping drug-eluting stents to his RCA.  His other vessels apparently were not intervened on.  He he is on aspirin and Brilinta.  He said no recurrent symptoms.

## 2017-10-25 ENCOUNTER — Telehealth: Payer: Self-pay | Admitting: *Deleted

## 2017-10-25 ENCOUNTER — Ambulatory Visit (HOSPITAL_COMMUNITY)
Admission: RE | Admit: 2017-10-25 | Discharge: 2017-10-25 | Disposition: A | Discharge status: Home / Self Care | Payer: Medicaid Other | Source: Ambulatory Visit | Attending: Cardiovascular Disease | Admitting: Cardiovascular Disease

## 2017-10-25 DIAGNOSIS — I739 Peripheral vascular disease, unspecified: Secondary | ICD-10-CM | POA: Insufficient documentation

## 2017-10-25 DIAGNOSIS — I10 Essential (primary) hypertension: Secondary | ICD-10-CM | POA: Insufficient documentation

## 2017-10-25 DIAGNOSIS — I2510 Atherosclerotic heart disease of native coronary artery without angina pectoris: Secondary | ICD-10-CM | POA: Diagnosis not present

## 2017-10-25 DIAGNOSIS — E785 Hyperlipidemia, unspecified: Secondary | ICD-10-CM | POA: Diagnosis not present

## 2017-10-25 DIAGNOSIS — F17200 Nicotine dependence, unspecified, uncomplicated: Secondary | ICD-10-CM | POA: Diagnosis not present

## 2017-10-25 DIAGNOSIS — E119 Type 2 diabetes mellitus without complications: Secondary | ICD-10-CM | POA: Insufficient documentation

## 2017-10-25 NOTE — Telephone Encounter (Signed)
   [facility] Pre-operative Risk Assessment    Request for surgical clearance:  1. What type of surgery is being performed? DENTAL WORK -  PROBING, SCALING, RESTORATIVE, EXTRACTIONS, AND ANY AND ALL DENTAL TREATMENT   2. When is this surgery scheduled? TBD   3. What type of clearance is required (medical clearance vs. Pharmacy clearance to hold med vs. Both)? BOTH  4. Are there any medications that need to be held prior to surgery and how long?  5. Practice name and name of physician performing surgery? [facility]    6. What is your office phone number? [phone]     7.   What is your office fax number?  [phone]  8.   Anesthesia type (None, local, MAC,  general) ?

## 2017-10-26 ENCOUNTER — Telehealth: Payer: Self-pay | Admitting: *Deleted

## 2017-10-26 ENCOUNTER — Ambulatory Visit (HOSPITAL_COMMUNITY): Payer: Medicaid Other | Attending: Cardiovascular Disease

## 2017-10-26 ENCOUNTER — Other Ambulatory Visit: Payer: Self-pay

## 2017-10-26 DIAGNOSIS — Z720 Tobacco use: Secondary | ICD-10-CM | POA: Diagnosis not present

## 2017-10-26 DIAGNOSIS — E785 Hyperlipidemia, unspecified: Secondary | ICD-10-CM | POA: Diagnosis not present

## 2017-10-26 DIAGNOSIS — I10 Essential (primary) hypertension: Secondary | ICD-10-CM | POA: Insufficient documentation

## 2017-10-26 DIAGNOSIS — I2510 Atherosclerotic heart disease of native coronary artery without angina pectoris: Secondary | ICD-10-CM | POA: Insufficient documentation

## 2017-10-26 DIAGNOSIS — I340 Nonrheumatic mitral (valve) insufficiency: Secondary | ICD-10-CM | POA: Diagnosis not present

## 2017-10-26 NOTE — Telephone Encounter (Signed)
Routing to Dr. Gwenlyn Found to see if pt can hold ASA and Brilinta for restorative dental procedure (probing, scaling and extractions).

## 2017-10-26 NOTE — Telephone Encounter (Addendum)
Left message for pt to call to discuss LEA's and echo results  ----- Message from Lorretta Harp, MD sent at 10/26/2017  5:22 PM EDT ----- Return office visit with me to discuss results at next available

## 2017-10-27 ENCOUNTER — Other Ambulatory Visit: Payer: Self-pay | Admitting: Cardiovascular Disease

## 2017-10-27 NOTE — Telephone Encounter (Signed)
Patient suffered an inferior wall myocardial infarction 09/15/2017 up in [place] and had late presentation with placement of 2 drug-eluting stents in his proximal LAD. For this reason, he CANNOT STOP ASA nor Brilinta for at least 1 year (May 2020). Any elective procedures that will require antiplatelets to be held will need to be postpone until after May 2020.   I will fax notification to requesting provider and will remove from preop pool.

## 2017-10-27 NOTE — Telephone Encounter (Signed)
New Message    *STAT* If patient is at the pharmacy, call can be transferred to refill team.   1. Which medications need to be refilled? (please list name of each medication and dose if known) BRILINTA 90 MG TABS tablet, carvedilol (COREG) 6.25 MG tablet, lisinopril (PRINIVIL,ZESTRIL) 10 MG tablet and rosuvastatin (CRESTOR) 5 MG tablet  2. Which pharmacy/location (including street and city if local pharmacy) is medication to be sent to? [place], [place] - [address]  3. Do they need a 30 day or 90 day supply? [place]

## 2017-10-27 NOTE — Telephone Encounter (Signed)
He cannot stop his aspirin or Brilinta for 1 year from his intervention last month

## 2017-10-27 NOTE — Telephone Encounter (Signed)
New Message   Pt returning call to debra about echo and LEA results. Please call

## 2017-10-28 NOTE — Telephone Encounter (Signed)
Spoke with pt, aware of results. Follow up scheduled  

## 2017-10-29 MED ORDER — LISINOPRIL 10 MG PO TABS: 10.0000 mg | ORAL_TABLET | Freq: Every day | ORAL | 3 refills | Status: DC

## 2017-10-29 MED ORDER — BRILINTA 90 MG PO TABS: 90.0000 mg | ORAL_TABLET | Freq: Two times a day (BID) | ORAL | 3 refills | Status: DC

## 2017-10-29 MED ORDER — ROSUVASTATIN CALCIUM 5 MG PO TABS: 5.0000 mg | ORAL_TABLET | Freq: Every day | ORAL | 3 refills | Status: DC

## 2017-10-29 MED ORDER — CARVEDILOL 6.25 MG PO TABS: 6.2500 mg | ORAL_TABLET | Freq: Two times a day (BID) | ORAL | 3 refills | Status: DC

## 2017-10-29 NOTE — Telephone Encounter (Signed)
Rx has been sent to the pharmacy electronically. ° °

## 2017-11-02 ENCOUNTER — Encounter: Payer: Self-pay | Admitting: Cardiovascular Disease

## 2017-11-02 ENCOUNTER — Ambulatory Visit: Payer: Medicaid Other | Admitting: Cardiovascular Disease

## 2017-11-02 DIAGNOSIS — I739 Peripheral vascular disease, unspecified: Secondary | ICD-10-CM | POA: Diagnosis not present

## 2017-11-02 DIAGNOSIS — I2510 Atherosclerotic heart disease of native coronary artery without angina pectoris: Secondary | ICD-10-CM | POA: Diagnosis not present

## 2017-11-02 NOTE — Assessment & Plan Note (Signed)
Eric Brooks returns today for follow-up of his Doppler studies performed because of claudication.  His right ABI is 0.65 and left was 1.02.  He had an occluded right SFA a moderately high-grade mid left SFA stenosis.  He is more symptomatic on the right.  We will plan on performing angiography and potential endovascular therapy for lifestyle limiting claudication.

## 2017-11-02 NOTE — H&P (View-Only) (Signed)
Mr. Maiello returns today for follow-up of his Doppler studies performed because of claudication.  His right ABI is 0.65 and left was 1.02.  He had an occluded right SFA a moderately high-grade mid left SFA stenosis.  He is more symptomatic on the right.  We will plan on performing angiography and potential endovascular therapy for lifestyle limiting claudication.   Lakeia Bradshaw J. Deloma Spindle, M.D., FACP, FACC, FAHA, FSCAI [facility] [address] [place], [place]  [address]  [phone] 11/02/2017 2:12 PM  

## 2017-11-02 NOTE — Assessment & Plan Note (Signed)
Continued tobacco abuse although trying to quit

## 2017-11-02 NOTE — Patient Instructions (Signed)
   Jack [address] [place] [address] Dept: [phone] Loc: [phone]  MCKOY BHAKTA  11/02/2017  You are scheduled for a Peripheral Angiogram on Monday, June 24 with Dr. Quay Burow.  1. Please arrive at the [facility] (Main Entrance A) at [facility]: [address] [place],  [address] at 7:30 AM (two hours before your procedure to ensure your preparation). Free valet parking service is available.   Special note: Every effort is made to have your procedure done on time. Please understand that emergencies sometimes delay scheduled procedures.  2. Diet: Do not eat or drink anything after midnight prior to your procedure except sips of water to take medications.  3. Labs: Please have labs drawn today in our office.  4. Medication instructions in preparation for your procedure:  Stop taking, Glucophage (Metformin) on Sunday, June 23.    On the morning of your procedure, take your Brilinta/Ticagrelor and aspirin and any morning medicines NOT listed above.  You may use sips of water.  5. Plan for one night stay--bring personal belongings. 6. Bring a current list of your medications and current insurance cards. 7. You MUST have a responsible person to drive you home. 8. Someone MUST be with you the first 24 hours after you arrive home or your discharge will be delayed. 9. Please wear clothes that are easy to get on and off and wear slip-on shoes.  Thank you for allowing [place] to care for you!   -- [facility]

## 2017-11-02 NOTE — Progress Notes (Signed)
Mr. Eric Brooks returns today for follow-up of his Doppler studies performed because of claudication.  His right ABI is 0.65 and left was 1.02.  He had an occluded right SFA a moderately high-grade mid left SFA stenosis.  He is more symptomatic on the right.  We will plan on performing angiography and potential endovascular therapy for lifestyle limiting claudication.   Lorretta Harp, M.D., [place], [facility], Laverta [place] [address] [place], [place]  [address]  [phone] 11/02/2017 2:12 PM

## 2017-11-02 NOTE — Assessment & Plan Note (Signed)
Recent 2D echo revealed EF of 45 to 50% mid inferior and inferoseptal hypokinesia.  The stents that were put in the past were a 3.0 x 12 and 15 resolute Onyx drug-eluting stent in the proximal and mid RCA.

## 2017-11-03 LAB — PROTIME-INR
INR: 1 (ref 0.8–1.2)
Prothrombin Time: 10.4 s (ref 9.1–12.0)

## 2017-11-03 LAB — CBC WITH DIFFERENTIAL/PLATELET
Basophils Absolute: 0 10*3/uL (ref 0.0–0.2)
Basos: 1 %
EOS (ABSOLUTE): 0.3 10*3/uL (ref 0.0–0.4)
Eos: 4 %
HEMATOCRIT: 44.4 % (ref 37.5–51.0)
Hemoglobin: 15.3 g/dL (ref 13.0–17.7)
IMMATURE GRANS (ABS): 0 10*3/uL (ref 0.0–0.1)
Immature Granulocytes: 0 %
LYMPHS ABS: 2 10*3/uL (ref 0.7–3.1)
LYMPHS: 24 %
MCH: 30.1 pg (ref 26.6–33.0)
MCHC: 34.5 g/dL (ref 31.5–35.7)
MCV: 87 fL (ref 79–97)
MONOCYTES: 7 %
Monocytes Absolute: 0.6 10*3/uL (ref 0.1–0.9)
NEUTROS ABS: 5.3 10*3/uL (ref 1.4–7.0)
Neutrophils: 64 %
Platelets: 225 10*3/uL (ref 150–450)
RBC: 5.09 x10E6/uL (ref 4.14–5.80)
RDW: 13.5 % (ref 12.3–15.4)
WBC: 8.3 10*3/uL (ref 3.4–10.8)

## 2017-11-03 LAB — APTT: aPTT: 30 s (ref 24–33)

## 2017-11-03 LAB — BASIC METABOLIC PANEL
BUN/Creatinine Ratio: 15 (ref 10–24)
BUN: 12 mg/dL (ref 8–27)
CALCIUM: 8.9 mg/dL (ref 8.6–10.2)
CHLORIDE: 104 mmol/L (ref 96–106)
CO2: 20 mmol/L (ref 20–29)
Creatinine, Ser: 0.8 mg/dL (ref 0.76–1.27)
GFR calc Af Amer: 112 mL/min/{1.73_m2} (ref >59)
GFR calc non Af Amer: 97 mL/min/{1.73_m2} (ref >59)
Glucose: 175 mg/dL — ABNORMAL HIGH (ref 65–99)
POTASSIUM: 4 mmol/L (ref 3.5–5.2)
Sodium: 140 mmol/L (ref 134–144)

## 2017-11-04 ENCOUNTER — Telehealth: Payer: Self-pay | Admitting: Cardiovascular Disease

## 2017-11-04 NOTE — Telephone Encounter (Signed)
Per Dr. Gwenlyn Found, pt procedure needed to be rescheduled to Thursday 11/11/17.  Spoke to pt who was agreeable to switch dates. Called Cath lab to make changes in pt chart. Scheduled for 6/27 at 9:30 AM. Explained to pt that all instructions and times are the same.  Pt verbalized understanding and thanks.

## 2017-11-10 ENCOUNTER — Other Ambulatory Visit: Payer: Self-pay | Admitting: Cardiovascular Disease

## 2017-11-10 DIAGNOSIS — I739 Peripheral vascular disease, unspecified: Secondary | ICD-10-CM

## 2017-11-11 ENCOUNTER — Encounter (HOSPITAL_COMMUNITY): Payer: Self-pay | Admitting: General Practice

## 2017-11-11 ENCOUNTER — Ambulatory Visit (HOSPITAL_COMMUNITY)
Admission: RE | Admit: 2017-11-11 | Discharge: 2017-11-12 | Disposition: A | Discharge status: Home / Self Care | Payer: Medicaid Other | Source: Ambulatory Visit | Attending: Cardiovascular Disease | Admitting: Cardiovascular Disease

## 2017-11-11 ENCOUNTER — Other Ambulatory Visit: Payer: Self-pay

## 2017-11-11 ENCOUNTER — Ambulatory Visit (HOSPITAL_COMMUNITY)
Admission: RE | Disposition: A | Discharge status: Home / Self Care | Payer: Self-pay | Source: Ambulatory Visit | Attending: Cardiovascular Disease

## 2017-11-11 DIAGNOSIS — I7092 Chronic total occlusion of artery of the extremities: Secondary | ICD-10-CM | POA: Insufficient documentation

## 2017-11-11 DIAGNOSIS — Z8673 Personal history of transient ischemic attack (TIA), and cerebral infarction without residual deficits: Secondary | ICD-10-CM | POA: Insufficient documentation

## 2017-11-11 DIAGNOSIS — I739 Peripheral vascular disease, unspecified: Secondary | ICD-10-CM | POA: Diagnosis present

## 2017-11-11 DIAGNOSIS — E1151 Type 2 diabetes mellitus with diabetic peripheral angiopathy without gangrene: Secondary | ICD-10-CM | POA: Insufficient documentation

## 2017-11-11 DIAGNOSIS — I70211 Atherosclerosis of native arteries of extremities with intermittent claudication, right leg: Secondary | ICD-10-CM

## 2017-11-11 DIAGNOSIS — I70219 Atherosclerosis of native arteries of extremities with intermittent claudication, unspecified extremity: Secondary | ICD-10-CM | POA: Diagnosis present

## 2017-11-11 DIAGNOSIS — I10 Essential (primary) hypertension: Secondary | ICD-10-CM | POA: Insufficient documentation

## 2017-11-11 DIAGNOSIS — I70213 Atherosclerosis of native arteries of extremities with intermittent claudication, bilateral legs: Secondary | ICD-10-CM | POA: Diagnosis not present

## 2017-11-11 DIAGNOSIS — E785 Hyperlipidemia, unspecified: Secondary | ICD-10-CM | POA: Diagnosis not present

## 2017-11-11 DIAGNOSIS — G4733 Obstructive sleep apnea (adult) (pediatric): Secondary | ICD-10-CM | POA: Insufficient documentation

## 2017-11-11 DIAGNOSIS — J449 Chronic obstructive pulmonary disease, unspecified: Secondary | ICD-10-CM | POA: Diagnosis not present

## 2017-11-11 HISTORY — DX: Pain in right leg: M79.604

## 2017-11-11 HISTORY — DX: Type 2 diabetes mellitus with diabetic polyneuropathy: E11.42

## 2017-11-11 HISTORY — DX: Essential (primary) hypertension: I10

## 2017-11-11 HISTORY — DX: Type 2 diabetes mellitus without complications: E11.9

## 2017-11-11 HISTORY — DX: Obstructive sleep apnea (adult) (pediatric): G47.33

## 2017-11-11 HISTORY — PX: ABDOMINAL AORTOGRAM: CATH118222

## 2017-11-11 HISTORY — DX: Headache: R51

## 2017-11-11 HISTORY — DX: Other chronic pain: G89.29

## 2017-11-11 HISTORY — DX: Low back pain: M54.5

## 2017-11-11 HISTORY — DX: Unspecified macular degeneration: H35.30

## 2017-11-11 HISTORY — DX: Headache, unspecified: R51.9

## 2017-11-11 HISTORY — DX: Emphysema, unspecified: J43.9

## 2017-11-11 HISTORY — DX: Dependence on other enabling machines and devices: Z99.89

## 2017-11-11 HISTORY — DX: Pain in left leg: M79.605

## 2017-11-11 HISTORY — DX: Atherosclerotic heart disease of native coronary artery without angina pectoris: I25.10

## 2017-11-11 HISTORY — DX: Low back pain, unspecified: M54.50

## 2017-11-11 HISTORY — PX: LOWER EXTREMITY ANGIOGRAPHY: CATH118251

## 2017-11-11 HISTORY — DX: Acute myocardial infarction, unspecified: I21.9

## 2017-11-11 HISTORY — PX: PERIPHERAL VASCULAR INTERVENTION: CATH118257

## 2017-11-11 LAB — GLUCOSE, CAPILLARY
GLUCOSE-CAPILLARY: 279 mg/dL — AB (ref 70–99)
GLUCOSE-CAPILLARY: 314 mg/dL — AB (ref 70–99)
Glucose-Capillary: 153 mg/dL — ABNORMAL HIGH (ref 70–99)
Glucose-Capillary: 162 mg/dL — ABNORMAL HIGH (ref 70–99)

## 2017-11-11 LAB — POCT ACTIVATED CLOTTING TIME
ACTIVATED CLOTTING TIME: 230 s
ACTIVATED CLOTTING TIME: 318 s

## 2017-11-11 SURGERY — LOWER EXTREMITY ANGIOGRAPHY
Anesthesia: LOCAL | Laterality: Right

## 2017-11-11 MED ORDER — HEPARIN SODIUM (PORCINE) 1000 UNIT/ML IJ SOLN
INTRAMUSCULAR | Status: AC
  Filled 2017-11-11: qty 1

## 2017-11-11 MED ORDER — CARVEDILOL 6.25 MG PO TABS
6.2500 mg | ORAL_TABLET | Freq: Two times a day (BID) | ORAL | Status: DC
  Administered 2017-11-11 – 2017-11-12 (×2): 6.25 mg via ORAL
  Filled 2017-11-11: qty 1
  Filled 2017-11-11 (×2): qty 2
  Filled 2017-11-11: qty 1

## 2017-11-11 MED ORDER — DIPHENHYDRAMINE HCL 50 MG/ML IJ SOLN
INTRAMUSCULAR | Status: AC
  Filled 2017-11-11: qty 1

## 2017-11-11 MED ORDER — ONDANSETRON HCL 4 MG/2ML IJ SOLN
4.0000 mg | Freq: Four times a day (QID) | INTRAMUSCULAR | Status: DC | PRN
Start: 2017-11-11 — End: 2017-11-12

## 2017-11-11 MED ORDER — IODIXANOL 320 MG/ML IV SOLN
INTRAVENOUS | Status: DC | PRN
  Administered 2017-11-11: 225 mL via INTRA_ARTERIAL

## 2017-11-11 MED ORDER — INSULIN ASPART 100 UNIT/ML ~~LOC~~ SOLN
0.0000 [IU] | Freq: Three times a day (TID) | SUBCUTANEOUS | Status: DC
  Administered 2017-11-12: 07:00:00 1 [IU] via SUBCUTANEOUS

## 2017-11-11 MED ORDER — SODIUM CHLORIDE 0.9% FLUSH
3.0000 mL | Freq: Two times a day (BID) | INTRAVENOUS | Status: DC
  Administered 2017-11-11: 3 mL via INTRAVENOUS

## 2017-11-11 MED ORDER — HEPARIN (PORCINE) IN NACL 2-0.9 UNITS/ML
INTRAMUSCULAR | Status: AC | PRN
  Administered 2017-11-11: 1000 mL

## 2017-11-11 MED ORDER — DIPHENHYDRAMINE HCL 50 MG/ML IJ SOLN
INTRAMUSCULAR | Status: DC | PRN
  Administered 2017-11-11: 25 mg via INTRAVENOUS

## 2017-11-11 MED ORDER — LIDOCAINE HCL (PF) 1 % IJ SOLN
INTRAMUSCULAR | Status: DC | PRN
  Administered 2017-11-11: 30 mL via SUBCUTANEOUS

## 2017-11-11 MED ORDER — MORPHINE SULFATE (PF) 2 MG/ML IV SOLN
2.0000 mg | INTRAVENOUS | Status: DC | PRN
  Administered 2017-11-11 (×2): 2 mg via INTRAVENOUS
  Filled 2017-11-11 (×2): qty 1

## 2017-11-11 MED ORDER — SODIUM CHLORIDE 0.9% FLUSH: 3.0000 mL | INTRAVENOUS | Status: DC | PRN

## 2017-11-11 MED ORDER — ALBUTEROL SULFATE (2.5 MG/3ML) 0.083% IN NEBU: 3.0000 mL | INHALATION_SOLUTION | RESPIRATORY_TRACT | Status: DC | PRN

## 2017-11-11 MED ORDER — MORPHINE SULFATE (PF) 10 MG/ML IV SOLN: 2.0000 mg | INTRAVENOUS | Status: DC | PRN

## 2017-11-11 MED ORDER — METHYLPREDNISOLONE SODIUM SUCC 125 MG IJ SOLR
INTRAMUSCULAR | Status: AC
  Filled 2017-11-11: qty 2

## 2017-11-11 MED ORDER — LIDOCAINE HCL (PF) 1 % IJ SOLN
INTRAMUSCULAR | Status: AC
  Filled 2017-11-11: qty 30

## 2017-11-11 MED ORDER — FENTANYL CITRATE (PF) 100 MCG/2ML IJ SOLN
INTRAMUSCULAR | Status: AC
Start: 2017-11-11 — End: ?
  Filled 2017-11-11: qty 2

## 2017-11-11 MED ORDER — FENTANYL CITRATE (PF) 100 MCG/2ML IJ SOLN
INTRAMUSCULAR | Status: DC | PRN
  Administered 2017-11-11: 25 ug via INTRAVENOUS

## 2017-11-11 MED ORDER — MIDAZOLAM HCL 2 MG/2ML IJ SOLN
INTRAMUSCULAR | Status: AC
  Filled 2017-11-11: qty 2

## 2017-11-11 MED ORDER — LISINOPRIL 10 MG PO TABS
10.0000 mg | ORAL_TABLET | Freq: Every day | ORAL | Status: DC
  Administered 2017-11-12: 09:00:00 10 mg via ORAL
  Filled 2017-11-11: qty 1

## 2017-11-11 MED ORDER — ASPIRIN EC 81 MG PO TBEC: 81.0000 mg | DELAYED_RELEASE_TABLET | Freq: Two times a day (BID) | ORAL | Status: DC

## 2017-11-11 MED ORDER — ASPIRIN 81 MG PO CHEW: 81.0000 mg | CHEWABLE_TABLET | ORAL | Status: DC

## 2017-11-11 MED ORDER — SODIUM CHLORIDE 0.9 % WEIGHT BASED INFUSION
1.0000 mL/kg/h | INTRAVENOUS | Status: DC
  Administered 2017-11-11: 250 mL via INTRAVENOUS

## 2017-11-11 MED ORDER — MIDAZOLAM HCL 2 MG/2ML IJ SOLN
INTRAMUSCULAR | Status: DC | PRN
  Administered 2017-11-11: 1 mg via INTRAVENOUS

## 2017-11-11 MED ORDER — TICAGRELOR 90 MG PO TABS
90.0000 mg | ORAL_TABLET | Freq: Two times a day (BID) | ORAL | Status: DC
  Administered 2017-11-11 – 2017-11-12 (×2): 90 mg via ORAL
  Filled 2017-11-11 (×2): qty 1

## 2017-11-11 MED ORDER — ACETAMINOPHEN 325 MG PO TABS
650.0000 mg | ORAL_TABLET | ORAL | Status: DC | PRN
  Filled 2017-11-11: qty 2

## 2017-11-11 MED ORDER — SODIUM CHLORIDE 0.9 % IV SOLN: 250.0000 mL | INTRAVENOUS | Status: DC | PRN

## 2017-11-11 MED ORDER — FAMOTIDINE IN NACL 20-0.9 MG/50ML-% IV SOLN
INTRAVENOUS | Status: AC | PRN
  Administered 2017-11-11: 20 mg via INTRAVENOUS

## 2017-11-11 MED ORDER — ANGIOPLASTY BOOK
Freq: Once | Status: AC
  Administered 2017-11-11: 1
  Filled 2017-11-11: qty 1

## 2017-11-11 MED ORDER — HYDRALAZINE HCL 20 MG/ML IJ SOLN: 5.0000 mg | INTRAMUSCULAR | Status: DC | PRN

## 2017-11-11 MED ORDER — LABETALOL HCL 5 MG/ML IV SOLN: 10.0000 mg | INTRAVENOUS | Status: DC | PRN

## 2017-11-11 MED ORDER — HEPARIN SODIUM (PORCINE) 1000 UNIT/ML IJ SOLN
INTRAMUSCULAR | Status: DC | PRN
  Administered 2017-11-11: 9000 [IU] via INTRAVENOUS
  Administered 2017-11-11: 2000 [IU] via INTRAVENOUS

## 2017-11-11 MED ORDER — HEPARIN (PORCINE) IN NACL 1000-0.9 UT/500ML-% IV SOLN
INTRAVENOUS | Status: AC
  Filled 2017-11-11: qty 1000

## 2017-11-11 MED ORDER — METHYLPREDNISOLONE SODIUM SUCC 125 MG IJ SOLR
INTRAMUSCULAR | Status: DC | PRN
  Administered 2017-11-11: 125 mg via INTRAVENOUS

## 2017-11-11 MED ORDER — FAMOTIDINE IN NACL 20-0.9 MG/50ML-% IV SOLN
INTRAVENOUS | Status: AC
  Filled 2017-11-11: qty 50

## 2017-11-11 MED ORDER — ASPIRIN EC 81 MG PO TBEC
81.0000 mg | DELAYED_RELEASE_TABLET | Freq: Every day | ORAL | Status: DC
  Administered 2017-11-12: 81 mg via ORAL
  Filled 2017-11-11: qty 1

## 2017-11-11 MED ORDER — SODIUM CHLORIDE 0.9 % IV SOLN
INTRAVENOUS | Status: AC
  Administered 2017-11-11 (×2): via INTRAVENOUS

## 2017-11-11 MED ORDER — ROSUVASTATIN CALCIUM 5 MG PO TABS
5.0000 mg | ORAL_TABLET | Freq: Every day | ORAL | Status: DC
  Administered 2017-11-11: 5 mg via ORAL
  Filled 2017-11-11 (×2): qty 1

## 2017-11-11 MED ORDER — SODIUM CHLORIDE 0.9 % WEIGHT BASED INFUSION
3.0000 mL/kg/h | INTRAVENOUS | Status: DC
  Administered 2017-11-11: 3 mL/kg/h via INTRAVENOUS

## 2017-11-11 MED ORDER — INSULIN ASPART 100 UNIT/ML ~~LOC~~ SOLN
0.0000 [IU] | Freq: Every day | SUBCUTANEOUS | Status: DC
  Administered 2017-11-11: 4 [IU] via SUBCUTANEOUS

## 2017-11-11 SURGICAL SUPPLY — 28 items
BALLN COYOTE OTW 2X220X150 (BALLOONS) ×4
BALLN STERLING OTW 5X220X150 (BALLOONS) ×4
BALLN STERLING OTW 6X220X150 (BALLOONS) ×4
BALLOON COYOTE OTW 2X220X150 (BALLOONS) ×3 IMPLANT
BALLOON STERLING OTW 5X220X150 (BALLOONS) ×3 IMPLANT
BALLOON STERLING OTW 6X220X150 (BALLOONS) ×3 IMPLANT
CATH ANGIO 5F PIGTAIL 65CM (CATHETERS) ×4 IMPLANT
CATH TEMPO 5F RIM 65CM (CATHETERS) ×4 IMPLANT
CATH VIANCE CROSS STAND 150CM (MICROCATHETER) ×4
CATH VIANCE CROSS STD 150CM (MICROCATHETER) ×3 IMPLANT
DEVICE SPIDERFX EMB PROT 6MM (WIRE) ×4 IMPLANT
DEVICE TORQUE .014-.018 (MISCELLANEOUS) ×3 IMPLANT
KIT ENCORE 26 ADVANTAGE (KITS) ×4 IMPLANT
KIT PV (KITS) ×4 IMPLANT
NEEDLE SMART 18GX3.5CM LONG (NEEDLE) ×4 IMPLANT
SHEATH HIGHFLEX ANSEL 7FR 55CM (SHEATH) ×4 IMPLANT
SHEATH PINNACLE 5F 10CM (SHEATH) ×4 IMPLANT
SHEATH PINNACLE 7F 10CM (SHEATH) ×4 IMPLANT
STENT VIABAHN 6X250X120 (Permanent Stent) ×4 IMPLANT
STOPCOCK MORSE 400PSI 3WAY (MISCELLANEOUS) ×4 IMPLANT
SYRINGE MEDRAD AVANTA MACH 7 (SYRINGE) ×4 IMPLANT
TAPE VIPERTRACK RADIOPAQ (MISCELLANEOUS) ×3 IMPLANT
TAPE VIPERTRACK RADIOPAQUE (MISCELLANEOUS) ×1
TORQUE DEVICE .014-.018 (MISCELLANEOUS) ×4
TRANSDUCER W/STOPCOCK (MISCELLANEOUS) ×4 IMPLANT
TRAY PV CATH (CUSTOM PROCEDURE TRAY) ×4 IMPLANT
WIRE HI TORQ VERSACORE J 260CM (WIRE) ×4 IMPLANT
WIRE SPARTACORE .014X300CM (WIRE) ×4 IMPLANT

## 2017-11-11 NOTE — Care Management Note (Signed)
Case Management Note  Patient Details  Name: Eric Brooks MRN: [id number] Date of Birth: Sep 27, 1955  Subjective/Objective:    From home, s/p pv intervention, he was on brilinta pta, will continue.                  Action/Plan: DC home when ready.  Expected Discharge Date:  11/12/17               Expected Discharge Plan:  Home/Self Care  In-House Referral:     Discharge planning Services  CM Consult  Post Acute Care Choice:    Choice offered to:     DME Arranged:    DME Agency:     HH Arranged:    HH Agency:     Status of Service:  Completed, signed off  If discussed at [employer] of Stay Meetings, dates discussed:    Additional Comments:  Zenon Mayo, RN 11/11/2017, 12:12 PM

## 2017-11-11 NOTE — Progress Notes (Signed)
Site area: right groin  Site Prior to Removal:  Level 1 bruising   Pressure Applied For 20 MINUTES    Minutes Beginning at 1525  Manual:   Yes.    Patient Status During Pull:  stable  Post Pull Groin Site:  Level 1, bruising   Post Pull Instructions Given:  Yes.    Post Pull Pulses Present:  Yes.    Dressing Applied:  Yes.    Comments:  Report to Chales Salmon RN.

## 2017-11-11 NOTE — Interval H&P Note (Signed)
History and Physical Interval Note:  11/11/2017 9:40 AM  [name]  has presented today for surgery, with the diagnosis of pad  The various methods of treatment have been discussed with the patient and family. After consideration of risks, benefits and other options for treatment, the patient has consented to  Procedure(s): LOWER EXTREMITY ANGIOGRAPHY (N/A) as a surgical intervention .  The patient's history has been reviewed, patient examined, no change in status, stable for surgery.  I have reviewed the patient's chart and labs.  Questions were answered to the patient's satisfaction.     Quay Burow

## 2017-11-12 ENCOUNTER — Other Ambulatory Visit: Payer: Self-pay | Admitting: Cardiology

## 2017-11-12 ENCOUNTER — Other Ambulatory Visit: Payer: Self-pay | Admitting: Cardiovascular Disease

## 2017-11-12 DIAGNOSIS — I739 Peripheral vascular disease, unspecified: Secondary | ICD-10-CM

## 2017-11-12 DIAGNOSIS — E1151 Type 2 diabetes mellitus with diabetic peripheral angiopathy without gangrene: Secondary | ICD-10-CM | POA: Diagnosis not present

## 2017-11-12 LAB — CBC
HEMATOCRIT: 40.9 % (ref 39.0–52.0)
Hemoglobin: 13.7 g/dL (ref 13.0–17.0)
MCH: 29.5 pg (ref 26.0–34.0)
MCHC: 33.5 g/dL (ref 30.0–36.0)
MCV: 88.1 fL (ref 78.0–100.0)
Platelets: 218 10*3/uL (ref 150–400)
RBC: 4.64 MIL/uL (ref 4.22–5.81)
RDW: 12.5 % (ref 11.5–15.5)
WBC: 15.9 10*3/uL — ABNORMAL HIGH (ref 4.0–10.5)

## 2017-11-12 LAB — BASIC METABOLIC PANEL
ANION GAP: 10 (ref 5–15)
BUN: 12 mg/dL (ref 6–20)
CALCIUM: 8.3 mg/dL — AB (ref 8.9–10.3)
CHLORIDE: 107 mmol/L (ref 98–111)
CO2: 20 mmol/L — AB (ref 22–32)
Creatinine, Ser: 0.83 mg/dL (ref 0.61–1.24)
GFR calc non Af Amer: >60 mL/min (ref >60)
GLUCOSE: 197 mg/dL — AB (ref 70–99)
POTASSIUM: 4.1 mmol/L (ref 3.5–5.1)
Sodium: 137 mmol/L (ref 135–145)

## 2017-11-12 LAB — GLUCOSE, CAPILLARY: Glucose-Capillary: 148 mg/dL — ABNORMAL HIGH (ref 70–99)

## 2017-11-12 MED ORDER — TRAMADOL HCL 50 MG PO TABS: 50.0000 mg | ORAL_TABLET | Freq: Four times a day (QID) | ORAL | 0 refills | Status: AC | PRN

## 2017-11-12 MED FILL — Lidocaine HCl Local Preservative Free (PF) Inj 1%: INTRAMUSCULAR | Qty: 30 | Status: AC

## 2017-11-12 NOTE — Discharge Summary (Addendum)
Discharge Summary    Patient ID: Eric Brooks,  MRN: [id number], DOB/AGE: 1955/06/17 62 y.o.  Admit date: 11/11/2017 Discharge date: 11/12/2017  Primary Care Provider: Barry Dienes Primary Cardiologist: Quay Burow, MD  Discharge Diagnoses    Active Problems:   Peripheral arterial disease [facility])   Claudication in peripheral vascular disease ([place])   Allergies Allergies  Allergen Reactions  . Amitriptyline Anaphylaxis    Pt reports elavil allergy  . Shellfish Allergy Anaphylaxis  . Tylenol [Acetaminophen] Shortness Of Breath and Rash    Diagnostic Studies/Procedures    Procedures Performed:               1.  Abdominal aortogram/bilateral iliac angiogram/bifemoral runoff               2.  Contralateral access (second order catheter placement)               3.  Crossing right SFA CTO with beyond catheter and placement of a spider distal protection device in the below the knee popliteal artery on the right               4.  PTA of right SFA CTO followed by Viabahn  covered stenting.  PROCEDURE DESCRIPTION:   The patient was brought to the second floor [place] [facility] cath lab in the the postabsorptive state. He was premedicated with IV Versed and fentanyl. His left groin was prepped and shaved in usual sterile fashion. Xylocaine 1% was used for local anesthesia. A 5 French sheath was inserted into the left common femoral artery artery using standard Seldinger technique and a smart needle.  The femoral artery was more lateral than usual.  A 5 French pigtail catheter was placed in the abdominal aorta.  Abdominal aortography, bilateral iliac angiography with bifemoral runoff was performed using bolus chase, digital subtraction and step table technique.  On the pigtail was used for the entirety of the case.  Retrograde aortic pressure was monitored during the case.  Angiographic Data:   1: Abdominal aorta- normal 2: Left lower extremity- 50% segmental mid left SFA.   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  The tibial vessels were not well visualized 3: Right lower extremity- the right SFA was occluded just beyond its origin and reconstituted in the adductor canal by profunda femoris collaterals.  There was three-vessel runoff.  IMPRESSION: Mr. Stahnke has long segment right SFA CTO with lifestyle limiting claudication.  We will proceed with attempt at percutaneous revascularization.   Final Impression: Successful long segment right SFA CTO PTA and covered stenting using a 250 mm long by 6 mm in diameter Viabahn covered stent with an excellent result.  The patient was already on dual antiplatelet therapy including aspirin and Brilinta for prior coronary intervention.  The sheath will be removed once ACT falls below 170 and pressure held.  He will be hydrated overnight and discharged home in the morning.  We will get lower extremity arterial Doppler studies in our [facility] line office next week and I will see him back in 2 to 3 weeks thereafter.  He left the lab in stable condition.  _____________   History of Present Illness     Eric Brooks a 62 y.o.moderately overweight single Caucasian male with no children.  He was referred by Barry Dienes PActo be established in our cardiovascular practice because of known peripheral vascular disease. He has a history of COPD with ongoing tobacco abuse and 52  pack years as well as treated diabetes, hypertension and hyperlipidemia. He does have obstructive sleep apnea on CPAP. He had 3 mini strokes in the past. He does complain of claudication right greater than left and Dopplers revealed a right ABI of 0.5 and a left of 1. 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 Other vessels were not intervened on. He's had no recurrent symptoms.  He has complained of right lower extremity claudication.   Dopplers in our office suggested an occluded right SFA.  He presents now for angiography and potential endovascular therapy for lifestyle limiting claudication.  Hospital Course     Consultants: None  Yesterday, 11/11/17, Mr. Chiaramonte underwent abdominal aortogram/bilateral iliac angiogram/bifemoral runoff and PTA of right SFA CTO followed by Viabahn covered stenting.  The patient was already on dual antiplatelet therapy including aspirin and Brilinta for prior coronary intervention.  He was hydrated overnight and has done well. He has ecchymosis and tenderness at the groin site but site is soft, no hematoma, no bruit. His H&H is stable.  We will get lower extremity arterial Doppler studies in our [facility] line office next week and I will see him back in 2 to 3 weeks thereafter.    We will provide a couple of days worth of tramadol for groin tenderness.   I discussed the role of smoking in PVD. The patient says that he has been smoking since the age of 42 and he has no intention of quitting.  He says that he will accept the consequences.  At discharge: Serum creatinine 0.83, hemoglobin 13.7, hematocrit 40.9  Patient has been seen by Dr. Johnsie Cancel today and deemed ready for discharge home. All follow up appointments have been scheduled. Discharge medications are listed below. _____________  Discharge Vitals Blood pressure 117/73, pulse 67, temperature (!) 97.4 F (36.3 C), resp. rate 18, height 5' 8.75" (1.746 m), weight 222 lb 10.6 oz (101 kg), SpO2 97 %.  Filed Weights   11/11/17 0804 11/12/17 0349  Weight: 217 lb (98.4 kg) 222 lb 10.6 oz (101 kg)   Physical Exam  Constitutional: He is oriented to person, place, and time. He appears well-developed and well-nourished. No distress.  HENT:  Head: Normocephalic and atraumatic.  Neck: Normal range of motion. Neck supple. No JVD present.  Cardiovascular: Normal rate, regular rhythm, normal heart sounds and intact distal pulses. Exam reveals no gallop  and no friction rub.  No murmur heard. Pulmonary/Chest: Effort normal and breath sounds normal. No respiratory distress. He has no wheezes. He has no rales.  Abdominal: Soft. Bowel sounds are normal.  Musculoskeletal: Normal range of motion. He exhibits no edema or deformity.  Neurological: He is alert and oriented to person, place, and time.  Skin: Skin is warm and dry.  Psychiatric: He has a normal mood and affect. His behavior is normal. Thought content normal.  Nursing note and vitals reviewed.    Labs & Radiologic Studies    CBC Recent Labs    11/12/17 0213  WBC 15.9*  HGB 13.7  HCT 40.9  MCV 88.1  PLT 782   Basic Metabolic Panel Recent Labs    11/12/17 0213  NA 137  K 4.1  CL 107  CO2 20*  GLUCOSE 197*  BUN 12  CREATININE 0.83  CALCIUM 8.3*   Liver Function Tests No results for input(s): AST, ALT, ALKPHOS, BILITOT, PROT, ALBUMIN in the last 72  hours. No results for input(s): LIPASE, AMYLASE in the last 72 hours. Cardiac Enzymes No results for input(s): CKTOTAL, CKMB, CKMBINDEX, TROPONINI in the last 72 hours. BNP Invalid input(s): POCBNP D-Dimer No results for input(s): DDIMER in the last 72 hours. Hemoglobin A1C No results for input(s): HGBA1C in the last 72 hours. Fasting Lipid Panel No results for input(s): CHOL, HDL, LDLCALC, TRIG, CHOLHDL, LDLDIRECT in the last 72 hours. Thyroid Function Tests No results for input(s): TSH, T4TOTAL, T3FREE, THYROIDAB in the last 72 hours.  Invalid input(s): FREET3 _____________  No results found. Disposition   Pt is being discharged home today in good condition.  Follow-up Plans & Appointments    Follow-up Information    Lorretta Harp, MD Follow up.   Specialties:  Cardiology, Radiology Why:  Follow up post-procedure with Dr. Gwenlyn Found on 11/30/17 at 9:45. Contact information: [address] [place] [address] [phone]        [facility] Follow up.   Specialty:   Cardiology Why:  Lower extremity doppler study on 11/17/17 at 1:30.  Contact information: [address] [place] [phone]         Discharge Instructions    Diet - low sodium heart healthy   Complete by:  As directed    Discharge instructions   Complete by:  As directed    PLEASE REMEMBER TO BRING ALL OF YOUR MEDICATIONS TO EACH OF YOUR FOLLOW-UP OFFICE VISITS.  PLEASE ATTEND ALL SCHEDULED FOLLOW-UP APPOINTMENTS.   Activity: Increase activity slowly as tolerated. You may shower, but no soaking baths (or swimming) for 1 week. No driving for 24 hours. No lifting over 5 lbs for 1 week. No sexual activity for 1 week.   You May Return to Work: in 1 week (if applicable)  Wound Care: You may wash cath site gently with soap and water. Keep cath site clean and dry. If you notice pain, swelling, bleeding or pus at your cath site, please call [phone].   Increase activity slowly   Complete by:  As directed       Discharge Medications   Allergies as of 11/12/2017      Reactions   Amitriptyline Anaphylaxis   Pt reports elavil allergy   Shellfish Allergy Anaphylaxis   Tylenol [acetaminophen] Shortness Of Breath, Rash      Medication List    TAKE these medications   aspirin EC 81 MG tablet Take 1 tablet by mouth 2 (two) times daily.   BRILINTA 90 MG Tabs tablet Generic drug:  ticagrelor Take 1 tablet (90 mg total) by mouth 2 (two) times daily.   carvedilol 6.25 MG tablet Commonly known as:  COREG Take 1 tablet (6.25 mg total) by mouth 2 (two) times daily.   clonazePAM 1 MG tablet Commonly known as:  KLONOPIN Take 1 mg by mouth 2 (two) times daily as needed for anxiety.   lisinopril 10 MG tablet Commonly known as:  PRINIVIL,ZESTRIL Take 1 tablet (10 mg total) by mouth daily.   metFORMIN 500 MG tablet Commonly known as:  GLUCOPHAGE Take 500 mg by mouth 2 (two) times daily with a meal.   pregabalin 100 MG capsule Commonly  known as:  LYRICA Take 100 mg by mouth 3 (three) times daily.   PROAIR HFA 108 (90 Base) MCG/ACT inhaler Generic drug:  albuterol Inhale 2 puffs into the lungs every 4 (four) hours as needed for wheezing or shortness of breath.   rosuvastatin 5 MG  tablet Commonly known as:  CRESTOR Take 1 tablet (5 mg total) by mouth daily.   tiZANidine 2 MG tablet Commonly known as:  ZANAFLEX Take 2 mg by mouth 3 (three) times daily.   traMADol 50 MG tablet Commonly known as:  ULTRAM Take 1 tablet (50 mg total) by mouth every 6 (six) hours as needed for moderate pain.        Acute coronary syndrome (MI, NSTEMI, STEMI, etc) this admission?: No.    Outstanding Labs/Studies   None  Duration of Discharge Encounter   Greater than 30 minutes including physician time.  Signed, Luna Fuse, NP 11/12/2017, 9:12 AM  Patient examined chart reviewed post stenting of right SFA for claudication. Large but soft echymosis over the left femoral area no bruit. Patient has ambulated Discussed smoking cessation will give 4 day script for tramadol for pain Outpatient f/u arranged with Dr Gwenlyn Found will need f/u ABI's post intervention  Jenkins Rouge

## 2017-11-12 NOTE — Discharge Instructions (Signed)
Peripheral Vascular Disease Peripheral vascular disease (PVD) is a disease of the blood vessels that are not part of your heart and brain. A simple term for PVD is poor circulation. In most cases, PVD narrows the blood vessels that carry blood from your heart to the rest of your body. This can result in a decreased supply of blood to your arms, legs, and internal organs, like your stomach or kidneys. However, it most often affects a persons lower legs and feet. There are two types of PVD.  Organic PVD. This is the more common type. It is caused by damage to the structure of blood vessels.  Functional PVD. This is caused by conditions that make blood vessels contract and tighten (spasm).  Without treatment, PVD tends to get worse over time. PVD can also lead to acute ischemic limb. This is when an arm or limb suddenly has trouble getting enough blood. This is a medical emergency. Follow these instructions at home:  Take medicines only as told by your doctor.  Do not use any tobacco products, including cigarettes, chewing tobacco, or electronic cigarettes. If you need help quitting, ask your doctor.  Lose weight if you are overweight, and maintain a healthy weight as told by your doctor.  Eat a diet that is low in fat and cholesterol. If you need help, ask your doctor.  Exercise regularly. Ask your doctor for some good activities for you.  Take good care of your feet. ? Wear comfortable shoes that fit well. ? Check your feet often for any cuts or sores. Contact a doctor if:  You have cramps in your legs while walking.  You have leg pain when you are at rest.  You have coldness in a leg or foot.  Your skin changes.  You are unable to get or have an erection (erectile dysfunction).  You have cuts or sores on your feet that are not healing. Get help right away if:  Your arm or leg turns cold and blue.  Your arms or legs become red, warm, swollen, painful, or numb.  You have  chest pain or trouble breathing.  You suddenly have weakness in your face, arm, or leg.  You become very confused or you cannot speak.  You suddenly have a very bad headache.  You suddenly cannot see. This information is not intended to replace advice given to you by your health care provider. Make sure you discuss any questions you have with your health care provider. Document Released: 07/29/2009 Document Revised: 10/10/2015 Document Reviewed: 10/12/2013 Elsevier Interactive Patient Education  2017 [employer]. Smoking Tobacco Information Smoking tobacco will very likely harm your health. Tobacco contains a poisonous (toxic), colorless chemical called nicotine. Nicotine affects the brain and makes tobacco addictive. This change in your brain can make it hard to stop smoking. Tobacco also has other toxic chemicals that can hurt your body and raise your risk of many cancers. How can smoking tobacco affect me? Smoking tobacco can increase your chances of having serious health conditions, such as:  Cancer. Smoking is most commonly associated with lung cancer, but can lead to cancer in other parts of the body.  Chronic obstructive pulmonary disease (COPD). This is a long-term lung condition that makes it hard to breathe. It also gets worse over time.  High blood pressure (hypertension), heart disease, stroke, or heart attack.  Lung infections, such as pneumonia.  Cataracts. This is when the lenses in the eyes become clouded.  Digestive problems. This may include  peptic ulcers, heartburn, and gastroesophageal reflux disease (GERD).  Oral health problems, such as gum disease and tooth loss.  Loss of taste and smell.  Smoking can affect your appearance by causing:  Wrinkles.  Yellow or stained teeth, fingers, and fingernails.  Smoking tobacco can also affect your social life.  Many workplaces, [employer], hotels, and public places are tobacco-free. This means that you may  experience challenges in finding places to smoke when away from home.  The cost of a smoking habit can be expensive. Expenses for someone who smokes come in two ways: ? You spend money on a regular basis to buy tobacco. ? Your health care costs in the long-term are higher if you smoke.  Tobacco smoke can also affect the health of those around you. Children of smokers have greater chances of: ? Sudden infant death syndrome (SIDS). ? Ear infections. ? Lung infections.  What lifestyle changes can be made?  Do not start smoking. Quit if you already do.  To quit smoking: ? Make a plan to quit smoking and commit yourself to it. Look for programs to help you and ask your health care provider for recommendations and ideas. ? Talk with your health care provider about using nicotine replacement medicines to help you quit. Medicine replacement medicines include gum, lozenges, patches, sprays, or pills. ? Do not replace cigarette smoking with electronic cigarettes, which are commonly called e-cigarettes. The safety of e-cigarettes is not known, and some may contain harmful chemicals. ? Avoid places, people, or situations that tempt you to smoke. ? If you try to quit but return to smoking, don't give up hope. It is very common for people to try a number of times before they fully succeed. When you feel ready again, give it another try.  Quitting smoking might affect the way you eat as well as your weight. Be prepared to monitor your eating habits. Get support in planning and following a healthy diet.  Ask your health care provider about having regular tests (screenings) to check for cancer. This may include blood tests, imaging tests, and other tests.  Exercise regularly. Consider taking walks, joining a gym, or doing yoga or exercise classes.  Develop skills to manage your stress. These skills include meditation. What are the benefits of quitting smoking? By quitting smoking, you may:  Lower your  risk of getting cancer and other diseases caused by smoking.  Live longer.  Breathe better.  Lower your blood pressure and heart rate.  Stop your addiction to tobacco.  Stop creating secondhand smoke that hurts other people.  Improve your sense of taste and smell.  Look better over time, due to having fewer wrinkles and less staining.  What can happen if changes are not made? If you do not stop smoking, you may:  Get cancer and other diseases.  Develop COPD or other long-term (chronic) lung conditions.  Develop serious problems with your heart and blood vessels (cardiovascular system).  Need more tests to screen for problems caused by smoking.  Have higher, long-term healthcare costs from medicines or treatments related to smoking.  Continue to have worsening changes in your lungs, mouth, and nose.  Where to find support: To get support to quit smoking, consider:  Asking your health care provider for more information and resources.  Taking classes to learn more about quitting smoking.  Looking for local organizations that offer resources about quitting smoking.  Joining a support group for people who want to quit smoking in your local  community.  Where to find more information: You may find more information about quitting smoking from:  HelpGuide.org: www.helpguide.org/articles/addictions/how-to-quit-smoking.htm  https://hall.com/: smokefree.gov  American Lung Association: www.lung.org  Contact a health care provider if:  You have problems breathing.  Your lips, nose, or fingers turn blue.  You have chest pain.  You are coughing up blood.  You feel faint or you pass out.  You have other noticeable changes that cause you to worry. Summary  Smoking tobacco can negatively affect your health, the health of those around you, your finances, and your social life.  Do not start smoking. Quit if you already do. If you need help quitting, ask your health care  provider.  Think about joining a support group for people who want to quit smoking in your local community. There are many effective programs that will help you to quit this behavior. This information is not intended to replace advice given to you by your health care provider. Make sure you discuss any questions you have with your health care provider. Document Released: 05/19/2016 Document Revised: 05/19/2016 Document Reviewed: 05/19/2016 Elsevier Interactive Patient Education  [employer].

## 2017-11-13 LAB — POCT ACTIVATED CLOTTING TIME: Activated Clotting Time: 98 seconds

## 2017-11-17 ENCOUNTER — Ambulatory Visit (HOSPITAL_COMMUNITY)
Admission: RE | Admit: 2017-11-17 | Discharge: 2017-11-17 | Disposition: A | Discharge status: Home / Self Care | Payer: Medicaid Other | Source: Ambulatory Visit | Attending: Cardiology | Admitting: Cardiology

## 2017-11-17 DIAGNOSIS — I739 Peripheral vascular disease, unspecified: Secondary | ICD-10-CM | POA: Diagnosis not present

## 2017-11-19 ENCOUNTER — Other Ambulatory Visit: Payer: Self-pay | Admitting: *Deleted

## 2017-11-19 DIAGNOSIS — I739 Peripheral vascular disease, unspecified: Secondary | ICD-10-CM

## 2017-11-30 ENCOUNTER — Encounter: Payer: Self-pay | Admitting: Cardiovascular Disease

## 2017-11-30 ENCOUNTER — Ambulatory Visit: Payer: Medicaid Other | Admitting: Cardiovascular Disease

## 2017-11-30 DIAGNOSIS — I739 Peripheral vascular disease, unspecified: Secondary | ICD-10-CM | POA: Diagnosis not present

## 2017-11-30 NOTE — Patient Instructions (Signed)

## 2017-11-30 NOTE — Progress Notes (Signed)
11/30/2017 [name]   Mar 25, 1956  [id number]  Primary Physician Barry Dienes, NP Primary Cardiologist: Lorretta Harp MD Garret Reddish, [place], [place]  HPI:  Eric Brooks is a 62 y.o.  moderately overweight single Caucasian male with no children  He was referred by Barry Dienes PActo be established in our cardiovascular practice because of known disease.  I last saw him in the office 11/02/2017. Hhas a history of COPD with ongoing tobacco abuse and 52 pack years as well as treated diabetes, hypertension and hyperlipidemia. He does have obstructive sleep apnea on CPAP. He had 3 mini strokes in the past. He does complain of claudication right greater than left and is a Dopplers that reveal a right ABI of 0.5 and a left of 1. 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 Other vessels were not intervened on. He said no recurrent symptoms. I performed peripheral angiography on him 11/11/2017 revealing a long segment right SFA CTO which I stented with a 6 mm x 250 mm viable on covered stent because of the suggestion that this was thrombotic.  He has three-vessel runoff on the right and left with a 50% segmental mid left SFA stenosis although he is asymptomatic on that side.  Since his procedure his claudication has resolved.  His Dopplers performed on 11/17/2017 showed normalization of his ABI with a widely patent right SFA.  He remains on dual antiplatelet therapy.  He does continue to smoke.   No outpatient medications have been marked as taking for the 11/30/17 encounter (Office Visit) with Lorretta Harp, MD.     Allergies  Allergen Reactions  . Amitriptyline Anaphylaxis    Pt reports elavil allergy  . Shellfish Allergy Anaphylaxis  . Tylenol [Acetaminophen] Shortness Of Breath and Rash    Social History   Socioeconomic History  . Marital status: Single   Spouse name: Not on file  . Number of children: Not on file  . Years of education: Not on file  . Highest education level: Not on file  Occupational History  . Not on file  Social Needs  . Financial resource strain: Not on file  . Food insecurity:    Worry: Not on file    Inability: Not on file  . Transportation needs:    Medical: Not on file    Non-medical: Not on file  Tobacco Use  . Smoking status: Current Every Day Smoker    Packs/day: 0.50    Years: 52.00    Pack years: 26.00    Types: Cigarettes  . Smokeless tobacco: Never Used  Substance and Sexual Activity  . Alcohol use: Not Currently  . 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 Sexual activity: Not Currently  Lifestyle  . Physical activity:    Days per week: Not on file    Minutes per session: Not on file  . Stress: Not on file  Relationships  . Social connections:    Talks on phone: Not on file    Gets together: Not on file    Attends religious service: Not on file    Active member of club or organization: Not on file    Attends meetings of clubs or organizations: Not on file    Relationship status: Not on file  . Intimate partner  violence:    Fear of current or ex partner: Not on file    Emotionally abused: Not on file    Physically abused: Not on file    Forced sexual activity: Not on file  Other Topics Concern  . Not on file  Social History Narrative  . Not on file     Review of Systems: General: negative for chills, fever, night sweats or weight changes.  Cardiovascular: negative for chest pain, dyspnea on exertion, edema, orthopnea, palpitations, paroxysmal nocturnal dyspnea or shortness of breath Dermatological: negative for rash Respiratory: negative for cough or wheezing Urologic: negative for hematuria Abdominal: negative for nausea, vomiting, diarrhea, bright red blood per rectum, melena, or hematemesis Neurologic: negative for visual  changes, syncope, or dizziness All other systems reviewed and are otherwise negative except as noted above.    Blood pressure 116/68, pulse 89, height 5\' 9"  (1.753 m), weight 217 lb (98.4 kg).  General appearance: alert and no distress Neck: no adenopathy, no carotid bruit, no JVD, supple, symmetrical, trachea midline and thyroid not enlarged, symmetric, no tenderness/mass/nodules Lungs: clear to auscultation bilaterally Heart: regular rate and rhythm, S1, S2 normal, no murmur, click, rub or gallop Extremities: extremities normal, atraumatic, no cyanosis or edema Pulses: 2+ and symmetric Skin: Skin color, texture, turgor normal. No rashes or lesions Neurologic: Alert and oriented X 3, normal strength and tone. Normal symmetric reflexes. Normal coordination and gait  EKG not performed today  ASSESSMENT AND PLAN:   Claudication in peripheral vascular disease ([place]) Status post for angiography and intervention 11/11/2017 revealing a long segment right SFA CTO which I stented using a 6 mm x 250 mm long Viabahn covered stent.  He did have three-vessel runoff bilaterally with a 50% segmental mid left SFA stenosis.  He is currently asymptomatic.  Dopplers performed on 11/17/2017 revealed normalization of his right ABI.  Continue dual anti-therapy and will continue to perform duplex surveillance every 6 months.      Lorretta Harp MD FACP,FACC,FAHA, [facility] 11/30/2017 10:30 AM

## 2017-11-30 NOTE — Assessment & Plan Note (Signed)
Status post for angiography and intervention 11/11/2017 revealing a long segment right SFA CTO which I stented using a 6 mm x 250 mm long Viabahn covered stent.  He did have three-vessel runoff bilaterally with a 50% segmental mid left SFA stenosis.  He is currently asymptomatic.  Dopplers performed on 11/17/2017 revealed normalization of his right ABI.  Continue dual anti-therapy and will continue to perform duplex surveillance every 6 months.

## 2017-12-02 ENCOUNTER — Telehealth: Payer: Self-pay | Admitting: Cardiovascular Disease

## 2017-12-02 NOTE — Telephone Encounter (Signed)
Spoke to pt who states [facility] never received the clearance paperwork that was faxed to them. Pt requesting that a copy be mailed to him to hand deliver. Pt made aware that a copy would be mailed. Verbalized understanding.

## 2017-12-02 NOTE — Telephone Encounter (Signed)
New Message    Patient is calling because [facility] states that they have not received the clearance information from June. Patient would like for it to be resent and then they want a copy. Please call to discuss.

## 2018-01-25 ENCOUNTER — Telehealth: Payer: Self-pay | Admitting: *Deleted

## 2018-01-25 ENCOUNTER — Ambulatory Visit: Payer: Medicaid Other | Admitting: Cardiovascular Disease

## 2018-01-25 ENCOUNTER — Ambulatory Visit: Payer: Medicaid Other | Admitting: Urology

## 2018-01-25 DIAGNOSIS — N448 Other noninflammatory disorders of the testis: Secondary | ICD-10-CM | POA: Diagnosis not present

## 2018-01-25 DIAGNOSIS — R972 Elevated prostate specific antigen [PSA]: Secondary | ICD-10-CM | POA: Diagnosis not present

## 2018-01-25 NOTE — Telephone Encounter (Signed)
   [facility] Pre-operative Risk Assessment    Request for surgical clearance:  1. What type of surgery is being performed? Prostate biopsy   2. When is this surgery scheduled? 03/29/18  3. What type of clearance is required (medical clearance vs. Pharmacy clearance to hold med vs. Both)? both  4. Are there any medications that need to be held prior to surgery and how long? Brilinta, ASA  5. Practice name and name of physician performing surgery? Dr. Diona Fanti   6. What is your office phone number [phone]    7.   What is your office fax number [phone] attention: Estill Bamberg  8.   Anesthesia type (None, local, MAC, general) ?    Eric Brooks 01/25/2018, 5:14 PM  _________________________________________________________________   (provider comments below)

## 2018-01-26 ENCOUNTER — Other Ambulatory Visit: Payer: Self-pay | Admitting: Urology

## 2018-01-26 DIAGNOSIS — R972 Elevated prostate specific antigen [PSA]: Secondary | ICD-10-CM

## 2018-01-26 NOTE — Telephone Encounter (Signed)
Patient needs to remain on uninterrupted dual antiplatelet therapy for at least 12 months.

## 2018-01-26 NOTE — Telephone Encounter (Signed)
Dr Gwenlyn Found your notes indicate the pt had an MI and DES in [place] in May 2019- Dr Diona Fanti wants to hold Brilinta and ASA for a prostate biopsy, can you comment.  Kerin Ransom PA-C 01/26/2018 4:29 PM

## 2018-02-08 ENCOUNTER — Other Ambulatory Visit: Payer: Self-pay | Admitting: *Deleted

## 2018-02-08 DIAGNOSIS — I739 Peripheral vascular disease, unspecified: Secondary | ICD-10-CM

## 2018-02-14 ENCOUNTER — Other Ambulatory Visit: Payer: Self-pay | Admitting: Cardiology

## 2018-02-15 ENCOUNTER — Other Ambulatory Visit: Payer: Self-pay | Admitting: Cardiovascular Disease

## 2018-02-15 MED ORDER — LISINOPRIL 10 MG PO TABS: 10.0000 mg | ORAL_TABLET | Freq: Every day | ORAL | 1 refills | Status: DC

## 2018-02-15 MED ORDER — TICAGRELOR 90 MG PO TABS: 90.0000 mg | ORAL_TABLET | Freq: Two times a day (BID) | ORAL | 1 refills | Status: DC

## 2018-02-15 MED ORDER — CARVEDILOL 6.25 MG PO TABS: 6.2500 mg | ORAL_TABLET | Freq: Two times a day (BID) | ORAL | 1 refills | Status: DC

## 2018-02-15 MED ORDER — ROSUVASTATIN CALCIUM 5 MG PO TABS: 5.0000 mg | ORAL_TABLET | Freq: Every day | ORAL | 1 refills | Status: DC

## 2018-02-15 NOTE — Telephone Encounter (Signed)
°*  STAT* If patient is at the pharmacy, call can be transferred to refill team.   1. Which medications need to be refilled? (please list name of each medication and dose if known) lisinopril 10 mg, carvedilol 6.25 mg, brilinta 90 mg rosuvastatin not sure    2. Which pharmacy/location (including street and city if local pharmacy) is medication to be sent to? [place]   3. Do they need a 30 day or 90 day supply? [place]

## 2018-02-15 NOTE — Telephone Encounter (Signed)
Rx(s) sent to pharmacy electronically.  

## 2018-02-17 ENCOUNTER — Telehealth: Payer: Self-pay

## 2018-02-17 NOTE — Telephone Encounter (Signed)
Dr. Naida Sleight note re: not stopping pt Brilinta for 12 months faxed to [facility] with Dr. Diona Fanti re: pt upcoming biopsy.

## 2018-03-29 ENCOUNTER — Ambulatory Visit (HOSPITAL_COMMUNITY)
Admission: RE | Admit: 2018-03-29 | Discharge: 2018-03-29 | Disposition: A | Discharge status: Home / Self Care | Payer: Medicaid Other | Source: Ambulatory Visit | Attending: Urology | Admitting: Urology

## 2018-03-29 ENCOUNTER — Encounter (HOSPITAL_COMMUNITY): Payer: Self-pay

## 2018-04-05 ENCOUNTER — Ambulatory Visit: Payer: Medicaid Other | Admitting: Urology

## 2018-04-05 DIAGNOSIS — R972 Elevated prostate specific antigen [PSA]: Secondary | ICD-10-CM | POA: Diagnosis not present

## 2018-04-05 DIAGNOSIS — N448 Other noninflammatory disorders of the testis: Secondary | ICD-10-CM | POA: Diagnosis not present

## 2018-04-18 ENCOUNTER — Other Ambulatory Visit: Payer: Self-pay | Admitting: Cardiovascular Disease

## 2018-05-13 ENCOUNTER — Other Ambulatory Visit: Payer: Self-pay | Admitting: Cardiovascular Disease

## 2018-05-13 MED ORDER — TICAGRELOR 90 MG PO TABS: 90.0000 mg | ORAL_TABLET | Freq: Two times a day (BID) | ORAL | 2 refills | Status: DC

## 2018-05-13 MED ORDER — LISINOPRIL 10 MG PO TABS: 10.0000 mg | ORAL_TABLET | Freq: Every day | ORAL | 2 refills | Status: DC

## 2018-05-13 MED ORDER — ROSUVASTATIN CALCIUM 5 MG PO TABS: 5.0000 mg | ORAL_TABLET | Freq: Every day | ORAL | 2 refills | Status: DC

## 2018-05-13 MED ORDER — CARVEDILOL 6.25 MG PO TABS: 6.2500 mg | ORAL_TABLET | Freq: Two times a day (BID) | ORAL | 2 refills | Status: DC

## 2018-05-13 NOTE — Telephone Encounter (Signed)
rx sent to pharmacy

## 2018-05-13 NOTE — Telephone Encounter (Signed)
°*  STAT* If patient is at the pharmacy, call can be transferred to refill team.   1. Which medications need to be refilled? (please list name of each medication and dose if known) - New Prescriptions for Briilinta, Carvedilol, Lisinopril and Rosuvastatin  2. Which pharmacy/location (including street and city if local pharmacy) is medication to be sent to? [facility] [phone]  3. Do they need a 30 day or 90 day supply? 90 days supply for each one

## 2018-05-18 DIAGNOSIS — C61 Malignant neoplasm of prostate: Secondary | ICD-10-CM

## 2018-05-18 HISTORY — DX: Malignant neoplasm of prostate: C61

## 2018-06-07 ENCOUNTER — Ambulatory Visit (HOSPITAL_COMMUNITY)
Admission: RE | Admit: 2018-06-07 | Discharge: 2018-06-07 | Disposition: A | Discharge status: Home / Self Care | Payer: Medicaid Other | Source: Ambulatory Visit | Attending: Cardiology | Admitting: Cardiology

## 2018-06-07 DIAGNOSIS — I739 Peripheral vascular disease, unspecified: Secondary | ICD-10-CM

## 2018-06-17 ENCOUNTER — Other Ambulatory Visit: Payer: Self-pay | Admitting: *Deleted

## 2018-06-17 DIAGNOSIS — I739 Peripheral vascular disease, unspecified: Secondary | ICD-10-CM

## 2018-08-09 ENCOUNTER — Other Ambulatory Visit: Payer: Self-pay | Admitting: Cardiovascular Disease

## 2018-08-09 MED ORDER — TICAGRELOR 90 MG PO TABS: 90.0000 mg | ORAL_TABLET | Freq: Two times a day (BID) | ORAL | 2 refills | Status: DC

## 2018-08-09 NOTE — Telephone Encounter (Signed)
Contacted pharmacy on file and spoke with pharmacist. She stated patient has enough Crestor medication until June and does not need any refills on any cardiac meds except Brilinta.   Left message informing patient that the medication that has been refilled was Brilinta.

## 2018-08-09 NOTE — Telephone Encounter (Signed)
°*  STAT* If patient is at the pharmacy, call can be transferred to refill team.   1. Which medications need to be refilled? (please list name of each medication and dose if known)  carvedilol (COREG) 6.25 MG tablet rosuvastatin (CRESTOR) 5 MG tablet lisinopril (PRINIVIL,ZESTRIL) 10 MG tablet ticagrelor (BRILINTA) 90 MG TABS tablet       2. Which pharmacy/location (including street and city if local pharmacy) is medication to be sent to? [place], [place]   3. Do they need a 30 day or 90 day supply? 54   PT was recently diagnosed with Prostate Cancer, and he does not want to risk exposure to the public. He would like refills on his medication through the end of the year if possible.   Pt also said that he would like clarification on the dosage of Carvedilol. He was told to reduce the dosage to 1x a day but he was previously taking the medication 2x a day

## 2018-10-17 ENCOUNTER — Telehealth: Payer: Self-pay | Admitting: Cardiovascular Disease

## 2018-10-17 NOTE — Telephone Encounter (Signed)
Yes, PLEASE continue Brilenta!!!

## 2018-10-17 NOTE — Telephone Encounter (Signed)
Patient had last PV intervention 11/11/2017. He has CAD as well. Does he need to continue Brilinta?

## 2018-10-17 NOTE — Telephone Encounter (Signed)
Patient wants to know if he is supposed to continue taking Brilinta

## 2018-10-19 NOTE — Telephone Encounter (Signed)
lmtcb

## 2018-11-07 ENCOUNTER — Telehealth: Payer: Self-pay

## 2018-11-07 NOTE — Telephone Encounter (Signed)
   [facility] Pre-operative Risk Assessment    Request for surgical clearance:  1. What type of surgery is being performed? Prostate biopsy   2. When is this surgery scheduled? 12/12/2018   3. What type of clearance is required (medical clearance vs. Pharmacy clearance to hold med vs. Both)? Medical and pharmacy clearance.  4. Are there any medications that need to be held prior to surgery and how long? Brilinta   5. Practice name and name of physician performing surgery? [facility], dr. Shirley Muscat.   6. What is your office phone number [phone]    7.   What is your office fax number [phone]  8.   Anesthesia type (None, local, MAC, general) ? unknown   Crissie Reese 11/07/2018, 3:58 PM  _________________________________________________________________   (provider comments below)

## 2018-11-08 NOTE — Telephone Encounter (Signed)
Call placed to pt re: surgical clearance below.  Left a message for him to call back.

## 2018-11-08 NOTE — Telephone Encounter (Signed)
   Primary Cardiologist:Jonathan Gwenlyn Found, MD  Chart reviewed as part of pre-operative protocol coverage. Pt has h/o CAD with MI 09/2017 s/p PCI, PAD s/p PTA 10/2017, tobacco, COPD, DM, HTN, HLD. He has an appointment to see Dr. Gwenlyn Found 12/02/18 at which time pre-op clearance can be addressed. (It is unclear if patient will be definitively continued on Brilinta at that time, discontinued or changed to Plavix therefore await that OV to for MD to decide.)  Per office protocol, the provider should assess clearance at time of office visit and should forward their finalized clearance decision to requesting party below. I will remove this message from the pre-op box.  Pre-op call back staff, please let patient know to discuss clearance at that visit. Will route this bundled recommendation to requesting provider via Epic fax function. Please call with questions.  Charlie Pitter, PA-C 11/08/2018, 11:39 AM

## 2018-11-14 ENCOUNTER — Telehealth: Payer: Self-pay | Admitting: Cardiovascular Disease

## 2018-11-14 NOTE — Telephone Encounter (Signed)
This request was addressed and faxed by Melina Copa PAC on 11/08/18. I re-faxed using the fax number provided on today's clearance request form.   Tami Lin Duke, PA-C 11/14/2018, 3:44 PM

## 2018-11-14 NOTE — Telephone Encounter (Signed)
New message      [facility] Pre-operative Risk Assessment    Request for surgical clearance:  1. What type of surgery is being performed? In office Prostate Biopsy  2. When is this surgery scheduled? 12/12/2018  3. What type of clearance is required (medical clearance vs. Pharmacy clearance to hold med vs. Both)? Pharmacy   4. Are there any medications that need to be held prior to surgery and how long?Brilinta for 3 days and aspirin for 5 days  5. Practice name and name of physician performing surgery? [facility]  Dr.Stephen Dahlstedt   6. What is your office phone number [phone] ext [phone]   7.   What is your office fax number [phone]   8.   Anesthesia type (None, local, MAC, general) ? local   Maryjane Hurter 11/14/2018, 3:01 PM  _________________________________________________________________   (provider comments below)

## 2018-11-25 ENCOUNTER — Telehealth: Payer: Self-pay

## 2018-11-25 NOTE — Telephone Encounter (Signed)
LMTCB regarding changing 7/17 appt from virtual to OV

## 2018-12-01 ENCOUNTER — Telehealth: Payer: Self-pay

## 2018-12-01 ENCOUNTER — Telehealth: Payer: Self-pay | Admitting: Cardiovascular Disease

## 2018-12-01 NOTE — Telephone Encounter (Signed)
New Message        Virtual Visit Pre-Appointment Phone Call  "(Name), I am calling you today to discuss your upcoming appointment. We are currently trying to limit exposure to the virus that causes COVID-19 by seeing patients at home rather than in the office."  1. "What is the BEST phone number to call the day of the visit?" - include this in appointment notes  2. Do you have or have access to (through a family member/friend) a smartphone with video capability that we can use for your visit?" a. If yes - list this number in appt notes as cell (if different from BEST phone #) and list the appointment type as a VIDEO visit in appointment notes b. If no - list the appointment type as a PHONE visit in appointment notes  3. Confirm consent - "In the setting of the current Covid19 crisis, you are scheduled for a (phone or video) visit with your provider on (date) at (time).  Just as we do with many in-office visits, in order for you to participate in this visit, we must obtain consent.  If you'd like, I can send this to your mychart (if signed up) or email for you to review.  Otherwise, I can obtain your verbal consent now.  All virtual visits are billed to your insurance company just like a normal visit would be.  By agreeing to a virtual visit, we'd like you to understand that the technology does not allow for your provider to perform an examination, and thus may limit your provider's ability to fully assess your condition. If your provider identifies any concerns that need to be evaluated in person, we will make arrangements to do so.  Finally, though the technology is pretty good, we cannot assure that it will always work on either your or our end, and in the setting of a video visit, we may have to convert it to a phone-only visit.  In either situation, we cannot ensure that we have a secure connection.  Are you willing to proceed?" STAFF: Did the patient verbally acknowledge consent to  telehealth visit? Document YES/NO here: YES       FULL LENGTH CONSENT FOR TELE-HEALTH VISIT   I hereby voluntarily request, consent and authorize [facility] and its employed or contracted physicians, physician assistants, nurse practitioners or other licensed health care professionals (the Practitioner), to provide me with telemedicine health care services (the Services") as deemed necessary by the treating Practitioner. I acknowledge and consent to receive the Services by the Practitioner via telemedicine. I understand that the telemedicine visit will involve communicating with the Practitioner through live audiovisual communication technology and the disclosure of certain medical information by electronic transmission. I acknowledge that I have been given the opportunity to request an in-person assessment or other available alternative prior to the telemedicine visit and am voluntarily participating in the telemedicine visit.  I understand that I have the right to withhold or withdraw my consent to the use of telemedicine in the course of my care at any time, without affecting my right to future care or treatment, and that the Practitioner or I may terminate the telemedicine visit at any time. I understand that I have the right to inspect all information obtained and/or recorded in the course of the telemedicine visit and may receive copies of available information for a reasonable fee.  I understand that some of the potential risks of receiving the Services via telemedicine include:   Delay or  interruption in medical evaluation due to technological equipment failure or disruption;  Information transmitted may not be sufficient (e.g. poor resolution of images) to allow for appropriate medical decision making by the Practitioner; and/or   In rare instances, security protocols could fail, causing a breach of personal health information.  Furthermore, I acknowledge that it is my responsibility  to provide information about my medical history, conditions and care that is complete and accurate to the best of my ability. I acknowledge that Practitioner's advice, recommendations, and/or decision may be based on factors not within their control, such as incomplete or inaccurate data provided by me or distortions of diagnostic images or specimens that may result from electronic transmissions. I understand that the practice of medicine is not an exact science and that Practitioner makes no warranties or guarantees regarding treatment outcomes. I acknowledge that I will receive a copy of this consent concurrently upon execution via email to the email address I last provided but may also request a printed copy by calling the office of [facility].    I understand that my insurance will be billed for this visit.   I have read or had this consent read to me.  I understand the contents of this consent, which adequately explains the benefits and risks of the Services being provided via telemedicine.   I have been provided ample opportunity to ask questions regarding this consent and the Services and have had my questions answered to my satisfaction.  I give my informed consent for the services to be provided through the use of telemedicine in my medical care  By participating in this telemedicine visit I agree to the above.

## 2018-12-01 NOTE — Telephone Encounter (Signed)
Spoke with pt and informed him that his appt for 7/17 would need to be OV or he would need to switch to a designated virtual day. Pt states he is free almost any day for an appointment and is okay with  being rescheduled for an appt on a designated virtual day. Pt stated he had to get off the phone and someone could call him tomorrow.   Nurse did not cancel appt. A message was sent to scheduling to contact the patient so that he can be rescheduled for an appointment on one of Dr. Kennon Holter designated virtual days

## 2018-12-02 ENCOUNTER — Telehealth: Payer: Medicaid Other | Admitting: Cardiovascular Disease

## 2018-12-08 ENCOUNTER — Telehealth: Payer: Self-pay | Admitting: Cardiovascular Disease

## 2018-12-08 NOTE — Telephone Encounter (Signed)
LVM, reminding pt of his appt on 12-09-18 with Dr Gwenlyn Found.

## 2018-12-09 ENCOUNTER — Telehealth: Payer: Self-pay

## 2018-12-09 ENCOUNTER — Telehealth (INDEPENDENT_AMBULATORY_CARE_PROVIDER_SITE_OTHER): Payer: Medicaid Other | Admitting: Cardiovascular Disease

## 2018-12-09 DIAGNOSIS — I739 Peripheral vascular disease, unspecified: Secondary | ICD-10-CM

## 2018-12-09 DIAGNOSIS — E782 Mixed hyperlipidemia: Secondary | ICD-10-CM | POA: Diagnosis not present

## 2018-12-09 DIAGNOSIS — I2510 Atherosclerotic heart disease of native coronary artery without angina pectoris: Secondary | ICD-10-CM

## 2018-12-09 DIAGNOSIS — I10 Essential (primary) hypertension: Secondary | ICD-10-CM | POA: Diagnosis not present

## 2018-12-09 DIAGNOSIS — Z720 Tobacco use: Secondary | ICD-10-CM

## 2018-12-09 NOTE — Telephone Encounter (Signed)
Patient and/or DPR-approved person aware of 7/24 AVS instructions and verbalized understanding.  Letter including After Visit Summary and any other necessary documents to be mailed to the patient's address on file.  

## 2018-12-09 NOTE — Progress Notes (Signed)
Virtual Visit via Telephone Note   This visit type was conducted due to national recommendations for restrictions regarding the COVID-19 Pandemic (e.g. social distancing) in an effort to limit this patient's exposure and mitigate transmission in our community.  Due to his co-morbid illnesses, this patient is at least at moderate risk for complications without adequate follow up.  This format is felt to be most appropriate for this patient at this time.  The patient did not have access to video technology/had technical difficulties with video requiring transitioning to audio format only (telephone).  All issues noted in this document were discussed and addressed.  No physical exam could be performed with this format.  Please refer to the patient's chart for his  consent to telehealth for [facility].   Date:  12/09/2018   ID:  [name], DOB 18-Nov-1955, MRN [id number]  Patient Location: Home Provider Location: Home  PCP:  Patient, No Pcp Per  Cardiologist:  Quay Burow, MD  Electrophysiologist:  None   Evaluation Performed:  Follow-Up Visit  Chief Complaint: Follow-up CAD and PAD  History of Present Illness:    Eric Brooks is a 63 y.o.  moderately overweight single Caucasian male with no children He was referred by Barry Dienes PActo be established in our cardiovascular practice because of known disease.  I last saw him in the office 11/30/2017. Hhas a history of COPD with ongoing tobacco abuse and 52 pack years as well as treated diabetes, hypertension and hyperlipidemia. He does have obstructive sleep apnea on CPAP. He had 3 mini strokes in the past. He does complain of claudication right greater than left and is a Dopplers that reveal a right ABI of 0.5 and a left of 1. 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 Other vessels were not intervened on. He  said no recurrent symptoms.  He did have a 2D echo performed 10/26/2017 that showed an EF of 45 to 50% with inferior hypokinesia. I performed peripheral angiography on him 11/11/2017 revealing a long segment right SFA CTO which I stented with a 6 mm x 250 mm viable on covered stent because of the suggestion that this was thrombotic.  He has three-vessel runoff on the right and left with a 50% segmental mid left SFA stenosis although he is asymptomatic on that side.  Since his procedure his claudication has resolved.  His Dopplers performed on 06/06/2018 revealed normal ABIs bilaterally with a patent right SFA stent.  He remains on dual antiplatelet therapy.  He continues to smoke a pack per day.  He denies chest pain shortness of breath or claudication.  The patient does not have symptoms concerning for COVID-19 infection (fever, chills, cough, or new shortness of breath).    Past Medical History:  Diagnosis Date   Anxiety    Arthritis    "they say it but I don't feel it" (11/11/2017)   Chronic lower back pain    Chronic pain of lower extremity, bilateral    COPD (chronic obstructive pulmonary disease) (HCC)    Coronary artery disease    Diabetic peripheral neuropathy (HCC)    Emphysema lung (HCC)    GERD (gastroesophageal reflux disease)    Headache    "monthly" (11/11/2017)   Hypercholesteremia    Hypertension    Macular degeneration, bilateral    Myocardial infarction ([place]) 09/15/2017   OSA on CPAP  Stroke ([place]) 09/27/2009 X 3   "left sided weakness;memory deficits since then" (11/11/2017)   Type II diabetes mellitus [facility])    Past Surgical History:  Procedure Laterality Date   ABDOMINAL AORTOGRAM N/A 11/11/2017   Procedure: ABDOMINAL AORTOGRAM;  Surgeon: Lorretta Harp, MD;  Location: [place] [facility];  Service: Cardiovascular;  Laterality: N/A;   CORONARY ANGIOPLASTY WITH STENT PLACEMENT  09/15/2017   "2 stents"   FRACTURE SURGERY     LOWER EXTREMITY  ANGIOGRAPHY Bilateral 11/11/2017   Procedure: LOWER EXTREMITY ANGIOGRAPHY;  Surgeon: Lorretta Harp, MD;  Location: [place] [facility];  Service: Cardiovascular;  Laterality: Bilateral;   ORIF RADIAL FRACTURE  03/28/2012   Procedure: OPEN REDUCTION INTERNAL FIXATION (ORIF) RADIAL FRACTURE;  Surgeon: Sanjuana Kava, MD;  Location: [facility];  Service: Orthopedics;  Laterality: Right;  ORIF Right Radial Shaft Fracture   PERIPHERAL VASCULAR INTERVENTION  11/11/2017   PERIPHERAL VASCULAR INTERVENTION Right 11/11/2017   Procedure: PERIPHERAL VASCULAR INTERVENTION;  Surgeon: Lorretta Harp, MD;  Location: [place] [facility];  Service: Cardiovascular;  Laterality: Right;  SFA   WISDOM TOOTH EXTRACTION       No outpatient medications have been marked as taking for the 12/09/18 encounter (Appointment) with Lorretta Harp, MD.     Allergies:   Amitriptyline, Shellfish allergy, and Tylenol [acetaminophen]   Social History   Tobacco Use   Smoking status: Current Every Day Smoker    Packs/day: 0.50    Years: 52.00    Pack years: 26.00    Types: Cigarettes   Smokeless tobacco: Never Used  Substance Use Topics   Alcohol use: Not Currently   Drug use: Not Currently    Types: LSD    Comment: 11/11/2017 "when I was a kid; 1970s"     Family Hx: The patient's family history includes Cancer in his father and mother.  ROS:   Please see the history of present illness.     All other systems reviewed and are negative.   Prior CV studies:   The following studies were reviewed today:  Lower extremity arterial Doppler studies performed 06/06/2018  Labs/Other Tests and Data Reviewed:    EKG:  No ECG reviewed.  Recent Labs: No results found for requested labs within last 8760 hours.   Recent Lipid Panel No results found for: CHOL, TRIG, HDL, CHOLHDL, LDLCALC, LDLDIRECT  Wt Readings from Last 3 Encounters:  11/30/17 217 lb (98.4 kg)  11/12/17 222 lb 10.6 oz (101 kg)  11/02/17 217  lb 12.8 oz (98.8 kg)     Objective:    Vital Signs:  There were no vitals taken for this visit.   VITAL SIGNS:  reviewed a complete physical exam was not performed today since this was a virtual telemedicine phone visit  ASSESSMENT & PLAN:    1. Coronary artery disease- history of CAD status post inferior STEMI 09/15/2017 treated in [place] with stent to his proximal LAD.  He remains on dual antiplatelet therapy including aspirin and Brilinta.  2D echo performed 10/26/2017 revealed EF of 45 to 50% with inferior hypokinesia. 2. Essential hypertension- history of essential hypertension on carvedilol, lisinopril.  He was unable to check his vital signs at home today 3. Hyperlipidemia- on Crestor followed by his PCP 4. Tobacco abuse- continues to smoke 1 pack/day recalcitrant to risk factor modification 5. Peripheral arterial disease- history of PAD status post right SFA CTO intervention with a 250 mm Viabahn  covered stent resulting in normalization of  his Dopplers 11/17/2017.  These have remained normal on his most recent Dopplers 06/06/2018. 6. Obstructive sleep apnea- on CPAP  COVID-19 Education: The signs and symptoms of COVID-19 were discussed with the patient and how to seek care for testing (follow up with PCP or arrange E-visit).  The importance of social distancing was discussed today.  Time:   Today, I have spent 8 minutes with the patient with telehealth technology discussing the above problems.     Medication Adjustments/Labs and Tests Ordered: Current medicines are reviewed at length with the patient today.  Concerns regarding medicines are outlined above.   Tests Ordered: No orders of the defined types were placed in this encounter.   Medication Changes: No orders of the defined types were placed in this encounter.   Follow Up:  In Person in 6 month(s)  Signed, Quay Burow, MD  12/09/2018 12:41 PM    Eric Brooks

## 2018-12-09 NOTE — Telephone Encounter (Signed)
New Message            Dr. Diona Fanti office called in today to get the surgery clearance for the patient but it was not done and it was suppose have being discussed at today's visit. So now they have to move appt. Pls send clearance.

## 2018-12-09 NOTE — Patient Instructions (Signed)
Medication Instructions:  Your physician recommends that you continue on your current medications as directed. Please refer to the Current Medication list given to you today.  If you need a refill on your cardiac medications before your next appointment, please call your pharmacy.   Lab work: NONE If you have labs (blood work) drawn today and your tests are completely normal, you will receive your results only by: Marland Kitchen MyChart Message (if you have MyChart) OR . A paper copy in the mail If you have any lab test that is abnormal or we need to change your treatment, we will call you to review the results.  Testing/Procedures: Your physician has requested that you have a lower or upper extremity arterial duplex. This test is an ultrasound of the arteries in the legs or arms. It looks at arterial blood flow in the legs and arms. Allow one hour for Lower and Upper Arterial scans. There are no restrictions or special instructions TO BE SCHEDULED FOR January 2021. YOU WILL BE CONTACTED BY A SCHEDULER TO SET UP THIS APPOINTMENT.  Your physician has requested that you have an ankle brachial index (ABI). During this test an ultrasound and blood pressure cuff are used to evaluate the arteries that supply the arms and legs with blood. Allow thirty minutes for this exam. There are no restrictions or special instructions. TO BE SCHEDULED FOR January 2021. YOU WILL BE CONTACTED BY A SCHEDULER TO SET UP THIS APPOINTMENT.   Follow-Up: At [facility], you and your health needs are our priority.  As part of our continuing mission to provide you with exceptional heart care, we have created designated Provider Care Teams.  These Care Teams include your primary Cardiologist (physician) and Advanced Practice Providers (APPs -  Physician Assistants and Nurse Practitioners) who all work together to provide you with the care you need, when you need it. You will need a follow up appointment in 6 months with Dr. Gwenlyn Found.   Please call our office 2 months in advance to schedule this appointment. Please have your ultrasounds completed before this appointment.

## 2018-12-09 NOTE — Telephone Encounter (Signed)
Pt had appt today 7/24 in your virtual clinic

## 2018-12-12 ENCOUNTER — Telehealth: Payer: Self-pay

## 2018-12-12 NOTE — Telephone Encounter (Signed)
Received a call from [place] with [facility] calling to verify if patient can stop Brilinta for upcoming prostate biopsy.Dr.Berry advised on 7/16 ok to hold Brilinta.Note faxed to [facility] at fax # [phone]

## 2018-12-12 NOTE — Telephone Encounter (Signed)
Cleared for prostate biopsy.  Can hold Brilinta.

## 2018-12-12 NOTE — Telephone Encounter (Signed)
Okay to hold Brilinta for prostate biopsy

## 2018-12-14 NOTE — Telephone Encounter (Signed)
   Primary Cardiologist:Jonathan Gwenlyn Found, MD  Chart reviewed as part of pre-operative protocol coverage. Pre-op clearance already addressed by colleagues in earlier phone notes. To summarize recommendations:  - Dr. Gwenlyn Found has cleared patient for prostate biopsy and has cleared him to hold Brilinta as requested for procedure. We typically advise that blood thinners be resumed when felt safe by performing physician.  It also appears Elly Modena may have already faxed this updated clearance as well (separate phone note dated 12/12/18).  Will route this bundled recommendation to requesting provider via Epic fax function. Please call with questions.  Charlie Pitter, PA-C 12/14/2018, 4:42 PM

## 2018-12-14 NOTE — Telephone Encounter (Signed)
Routed to preop pool

## 2018-12-27 ENCOUNTER — Other Ambulatory Visit: Payer: Self-pay | Admitting: Urology

## 2018-12-27 ENCOUNTER — Other Ambulatory Visit (HOSPITAL_COMMUNITY): Payer: Self-pay | Admitting: Urology

## 2018-12-27 DIAGNOSIS — C61 Malignant neoplasm of prostate: Secondary | ICD-10-CM

## 2019-01-06 ENCOUNTER — Encounter (HOSPITAL_COMMUNITY): Payer: Self-pay

## 2019-01-06 ENCOUNTER — Other Ambulatory Visit: Payer: Self-pay

## 2019-01-06 ENCOUNTER — Ambulatory Visit (HOSPITAL_COMMUNITY)
Admission: RE | Admit: 2019-01-06 | Discharge: 2019-01-06 | Disposition: A | Discharge status: Home / Self Care | Payer: Medicaid Other | Source: Ambulatory Visit | Attending: Cardiology | Admitting: Cardiology

## 2019-01-06 DIAGNOSIS — I739 Peripheral vascular disease, unspecified: Secondary | ICD-10-CM

## 2019-01-10 ENCOUNTER — Encounter (HOSPITAL_COMMUNITY)
Admission: RE | Admit: 2019-01-10 | Discharge: 2019-01-10 | Disposition: A | Discharge status: Home / Self Care | Payer: Medicaid Other | Source: Ambulatory Visit | Attending: Urology | Admitting: Urology

## 2019-01-10 ENCOUNTER — Other Ambulatory Visit: Payer: Self-pay

## 2019-01-10 DIAGNOSIS — C61 Malignant neoplasm of prostate: Secondary | ICD-10-CM | POA: Diagnosis present

## 2019-01-10 MED ORDER — TECHNETIUM TC 99M MEDRONATE IV KIT
20.5000 | PACK | Freq: Once | INTRAVENOUS | Status: AC
  Administered 2019-01-10: 20.5 via INTRAVENOUS

## 2019-01-13 ENCOUNTER — Ambulatory Visit (INDEPENDENT_AMBULATORY_CARE_PROVIDER_SITE_OTHER): Payer: Medicaid Other | Admitting: Cardiovascular Disease

## 2019-01-13 ENCOUNTER — Encounter: Payer: Self-pay | Admitting: Cardiovascular Disease

## 2019-01-13 ENCOUNTER — Other Ambulatory Visit: Payer: Self-pay

## 2019-01-13 VITALS — BP 138/80 | HR 85 | Temp 97.5°F | Ht 68.0 in | Wt 215.0 lb

## 2019-01-13 DIAGNOSIS — I10 Essential (primary) hypertension: Secondary | ICD-10-CM | POA: Diagnosis not present

## 2019-01-13 DIAGNOSIS — I739 Peripheral vascular disease, unspecified: Secondary | ICD-10-CM | POA: Diagnosis not present

## 2019-01-13 DIAGNOSIS — I2510 Atherosclerotic heart disease of native coronary artery without angina pectoris: Secondary | ICD-10-CM | POA: Diagnosis not present

## 2019-01-13 DIAGNOSIS — E782 Mixed hyperlipidemia: Secondary | ICD-10-CM | POA: Diagnosis not present

## 2019-01-13 NOTE — Assessment & Plan Note (Signed)
History of peripheral arterial disease status post PTA with a Viabahn covered stenting of a right SFA CTO 11/11/2017 with a via bond covered stent (6 mm x 250 mm).  Follow-up Doppler studies performed 06/07/2018 revealed normal ABIs bilaterally with a widely patent stent.  He remains on aspirin and ticagrelor.

## 2019-01-13 NOTE — Progress Notes (Signed)
01/13/2019 JEHAN HAGEDORN   12-16-1955  [id number]  Primary Physician Patient, No Pcp Per Primary Cardiologist: Lorretta Harp MD Garret Reddish, [place], [place]  HPI:  Eric Brooks is a 63 y.o.  moderately overweight single Caucasian male with no children He was referred by Barry Dienes PActo be established in our cardiovascular practice because of known disease.I last saw him in the office  12/09/2018. Hhas a history of COPD with ongoing tobacco abuse and 52 pack years as well as treated diabetes, hypertension and hyperlipidemia. He does have obstructive sleep apnea on CPAP. He had 3 mini strokes in the past. He does complain of claudication right greater than left and is a Dopplers that reveal a right ABI of 0.5 and a left of 1. He sufferedainferior wallmyocardial infarction5/05/2017 up in [place] and had late presentation with placement of 2 drug-eluting stents in his proximal LAD. Other vessels were not intervened on. He said no recurrent symptoms.  He did have a 2D echo performed 10/26/2017 that showed an EF of 45 to 50% with inferior hypokinesia. I performed peripheral angiography on him 11/11/2017 revealing a long segment right SFA CTO which I stented with a 6 mm x 250 mm viable on covered stent because of the suggestion that this was thrombotic. He has three-vessel runoff on the right and left with a 50% segmental mid left SFA stenosis although he is asymptomatic on that side. Since his procedure his claudication has resolved. His Dopplers performed on 06/06/2018 revealed normal ABIs bilaterally with a patent right SFA stent.  He remains on dual antiplatelet therapy.  He continues to smoke a pack per day.  He denies chest pain shortness of breath or claudication.  Unfortunately, since I saw him last month he has been diagnosed with metastatic prostate cancer and is currently being evaluated for extent of disease and potential therapy.  He is under a lot of stress from this, his  chronic medical conditions and COVID-19.  He does complain of occasional atypical chest pain.    Current Meds  Medication Sig   aspirin EC 81 MG tablet Take 1 tablet by mouth 2 (two) times daily.    atorvastatin (LIPITOR) 20 MG tablet Take 20 mg by mouth daily.   carvedilol (COREG) 6.25 MG tablet Take 1 tablet (6.25 mg total) by mouth 2 (two) times daily.   clonazePAM (KLONOPIN) 1 MG tablet Take 1 mg by mouth 2 (two) times daily as needed for anxiety.    lisinopril (PRINIVIL,ZESTRIL) 10 MG tablet Take 1 tablet (10 mg total) by mouth daily.   metFORMIN (GLUCOPHAGE) 500 MG tablet Take 500 mg by mouth 2 (two) times daily with a meal.   pregabalin (LYRICA) 100 MG capsule Take 100 mg by mouth 3 (three) times daily.    PROAIR HFA 108 (90 Base) MCG/ACT inhaler Inhale 2 puffs into the lungs every 4 (four) hours as needed for wheezing or shortness of breath.    rosuvastatin (CRESTOR) 5 MG tablet Take 1 tablet (5 mg total) by mouth daily.   ticagrelor (BRILINTA) 90 MG TABS tablet Take 1 tablet (90 mg total) by mouth 2 (two) times daily.   tiZANidine (ZANAFLEX) 2 MG tablet Take 2 mg by mouth 3 (three) times daily.      Allergies  Allergen Reactions   Amitriptyline Anaphylaxis    Pt reports elavil allergy   Shellfish Allergy Anaphylaxis   Tylenol [Acetaminophen] Shortness Of Breath and Rash    Social History  Socioeconomic History   Marital status: Single    Spouse name: Not on file   Number of children: Not on file   Years of education: Not on file   Highest education level: Not on file  Occupational History   Not on file  Social Needs   Financial resource strain: Not on file   Food insecurity    Worry: Not on file    Inability: Not on file   Transportation needs    Medical: Not on file    Non-medical: Not on file  Tobacco Use   Smoking status: Current Every Day Smoker    Packs/day: 0.50    Years: 52.00    Pack years: 26.00    Types: Cigarettes    Smokeless tobacco: Never Used  Substance and Sexual Activity   Alcohol use: Not Currently   Drug use: Not Currently    Types: LSD    Comment: 11/11/2017 "when I was a kid; 1970s"   Sexual activity: Not Currently  Lifestyle   Physical activity    Days per week: Not on file    Minutes per session: Not on file   Stress: Not on file  Relationships   Social connections    Talks on phone: Not on file    Gets together: Not on file    Attends religious service: Not on file    Active member of club or organization: Not on file    Attends meetings of clubs or organizations: Not on file    Relationship status: Not on file   Intimate partner violence    Fear of current or ex partner: Not on file    Emotionally abused: Not on file    Physically abused: Not on file    Forced sexual activity: Not on file  Other Topics Concern   Not on file  Social History Narrative   Not on file     Review of Systems: General: negative for chills, fever, night sweats or weight changes.  Cardiovascular: negative for chest pain, dyspnea on exertion, edema, orthopnea, palpitations, paroxysmal nocturnal dyspnea or shortness of breath Dermatological: negative for rash Respiratory: negative for cough or wheezing Urologic: negative for hematuria Abdominal: negative for nausea, vomiting, diarrhea, bright red blood per rectum, melena, or hematemesis Neurologic: negative for visual changes, syncope, or dizziness All other systems reviewed and are otherwise negative except as noted above.    Blood pressure 138/80, pulse 85, temperature (!) 97.5 F (36.4 C), height 5\' 8"  (1.727 m), weight 215 lb (97.5 kg).  General appearance: alert and no distress Neck: no adenopathy, no carotid bruit, no JVD, supple, symmetrical, trachea midline and thyroid not enlarged, symmetric, no tenderness/mass/nodules Lungs: clear to auscultation bilaterally Heart: regular rate and rhythm, S1, S2 normal, no murmur, click, rub or  gallop Extremities: extremities normal, atraumatic, no cyanosis or edema Pulses: 2+ and symmetric Skin: Skin color, texture, turgor normal. No rashes or lesions Neurologic: Alert and oriented X 3, normal strength and tone. Normal symmetric reflexes. Normal coordination and gait  EKG normal sinus rhythm at 85 with inferior Q waves and early R wave transition with nonspecific ST and T wave changes.  I personally reviewed this EKG.  ASSESSMENT AND PLAN:   Tobacco abuse Ongoing tobacco use of 1/2 to 1 pack/day recalcitrant to respect modification.  Hyperlipidemia History of hyperlipidemia on statin therapy followed by his PCP  Essential hypertension History of essential hypertension with blood pressure measured today 138/80.  He is on carvedilol and lisinopril.  Peripheral arterial disease (HCC) History of peripheral arterial disease status post PTA with a Viabahn covered stenting of a right SFA CTO 11/11/2017 with a via bond covered stent (6 mm x 250 mm).  Follow-up Doppler studies performed 06/07/2018 revealed normal ABIs bilaterally with a widely patent stent.  He remains on aspirin and ticagrelor.  Coronary artery disease History of CAD status post inferior wall myocardial infarction 09/15/2017 up in [place] treated with PCI and drug-eluting stenting using 2 drug-eluting stents in the proximal LAD.  The other vessels were not intervened on.  Echo performed 10/26/2017 revealed EF of 45 to 50% with inferior hypokinesia.  He gets occasional atypical chest pain.      Lorretta Harp MD FACP,FACC,FAHA, [facility] 01/13/2019 11:51 AM

## 2019-01-13 NOTE — Assessment & Plan Note (Signed)
Ongoing tobacco use of 1/2 to 1 pack/day recalcitrant to respect modification.

## 2019-01-13 NOTE — Patient Instructions (Addendum)
Medication Instructions:  Your physician recommends that you continue on your current medications as directed. Please refer to the Current Medication list given to you today.  If you need a refill on your cardiac medications before your next appointment, please call your pharmacy.   Lab work: none If you have labs (blood work) drawn today and your tests are completely normal, you will receive your results only by: Marland Kitchen MyChart Message (if you have MyChart) OR . A paper copy in the mail If you have any lab test that is abnormal or we need to change your treatment, we will call you to review the results.  Testing/Procedures: none  Follow-Up: At [facility], you and your health needs are our priority.  As part of our continuing mission to provide you with exceptional heart care, we have created designated Provider Care Teams.  These Care Teams include your primary Cardiologist (physician) and Advanced Practice Providers (APPs -  Physician Assistants and Nurse Practitioners) who all work together to provide you with the care you need, when you need it. . You will need a follow up appointment in 6 months with an APP and in 12 months with Dr. Quay Burow.  Please call our office 2 months in advance to schedule this/each appointment.  You may see one of the following Advanced Practice Providers on your designated Care Team:   . Kerin Ransom, PA-C . Daleen Snook Kroeger, PA-C . Sande Rives, PA-C . Almyra Deforest, PA-C . Fabian Sharp, PA-C . Jory Sims, DNP . Rosaria Ferries, PA-C

## 2019-01-13 NOTE — Assessment & Plan Note (Signed)
History of hyperlipidemia on statin therapy followed by his PCP 

## 2019-01-13 NOTE — Assessment & Plan Note (Signed)
History of CAD status post inferior wall myocardial infarction 09/15/2017 up in [place] treated with PCI and drug-eluting stenting using 2 drug-eluting stents in the proximal LAD.  The other vessels were not intervened on.  Echo performed 10/26/2017 revealed EF of 45 to 50% with inferior hypokinesia.  He gets occasional atypical chest pain.

## 2019-01-13 NOTE — Assessment & Plan Note (Signed)
History of essential hypertension with blood pressure measured today 138/80.  He is on carvedilol and lisinopril.

## 2019-02-07 ENCOUNTER — Encounter: Payer: Self-pay | Admitting: *Deleted

## 2019-02-08 NOTE — Progress Notes (Signed)
GU Location of Tumor / Histology: prostatic adenocarcinoma  If Prostate Cancer, Gleason Score is (4 + 3) and PSA is (6.92). Prostate volume: 24 mL.  Eric Brooks has a ten year history of a painful knot left scrotum that has been unchanged. PSA found to be elevated at 6.92 when checked by PCP in [facility].  Biopsies of prostate (if applicable) revealed:    Past/Anticipated interventions by urology, if any: prostate biopsy, bone scan (negative for mets), CT (negative for adenopathy), referral to Dr. Tammi Klippel  Past/Anticipated interventions by medical oncology, if any: no  Weight changes, if any: no  Bowel/Bladder complaints, if any: IPSS 5. SHIM 1. Denies dysuria or hematuria. Denies urinary leakage or incontinence. Reports urinary urgency. Denies any bowel complaints.  Nausea/Vomiting, if any: no  Pain issues, if any:  Reports abdominal pain constant 4 on a scale of 0-10 x 4 years  SAFETY ISSUES:  Prior radiation? denies  Pacemaker/ICD? denies  Possible current pregnancy? no, male patient  Is the patient on methotrexate? denies  Current Complaints / other details:  63 year old male. Single. Disabled. CAD. Fairly inactive.

## 2019-02-10 ENCOUNTER — Encounter: Payer: Self-pay | Admitting: Radiation Oncology

## 2019-02-10 ENCOUNTER — Other Ambulatory Visit: Payer: Self-pay

## 2019-02-10 ENCOUNTER — Ambulatory Visit
Admission: RE | Admit: 2019-02-10 | Discharge: 2019-02-10 | Disposition: A | Discharge status: Home / Self Care | Payer: Medicaid Other | Source: Ambulatory Visit | Attending: Radiation Oncology | Admitting: Radiation Oncology

## 2019-02-10 VITALS — Ht 68.5 in | Wt 214.0 lb

## 2019-02-10 DIAGNOSIS — C61 Malignant neoplasm of prostate: Secondary | ICD-10-CM

## 2019-02-10 NOTE — Progress Notes (Signed)
Radiation Oncology         [phone] ________________________________  Initial outpatient Consultation - Conducted via Telephone due to current COVID-19 concerns for limiting patient exposure  Name: Eric Brooks MRN: [id number]  Date: 02/10/2019  DOB: 29-Jul-1955  [id number], No Pcp Per  Franchot Gallo, MD   REFERRING PHYSICIAN: Franchot Gallo, MD  DIAGNOSIS: 63 y.o. gentleman with Stage T2a adenocarcinoma of the prostate with Gleason score of 4+3, and PSA of 6.89.    ICD-10-CM   1. Malignant neoplasm of prostate [facility])  C61     HISTORY OF PRESENT ILLNESS: Eric Brooks is a 63 y.o. male with a diagnosis of prostate cancer. He was noted to have an elevated PSA of 6.92 by his primary care physician, Dr. Markus Jarvis.  Accordingly, he was referred for evaluation in urology by Dr. Diona Fanti on 01/25/2018,  digital rectal examination was performed at that time revealing a 6 mm left mid nodule. A prostate biopsy was recommended at that time but he was hesitant to proceed due to cardiac issues.  A repeat PSA taken in 03/2018 was down to 4.7, but it was back up to 6.89 in 08/2018. There was a delay between then and his biopsy due to receiving clearance from his cardiologist as well as concerns regarding the COVID-19 pandemic. The patient proceeded to transrectal ultrasound with 12 biopsies of the prostate on 12/12/2018.  The prostate volume measured 24 cc.  Out of 12 core biopsies, 10 were positive.  The maximum Gleason score was 4+3, and this was seen in left apex lateral. Additionally, Gleason 3+4 was seen in left base lateral (with perineural invasion), left mid lateral (PNI), left base, left mid, right mid, and right mid lateral (PNI) and Gleason 3+3 was seen in right apex, right base (small focus), and left apex.  He had a CT pelvis on 01/10/2019, which showed no adenopathy or regional osseous metastatic disease. A bone scan performed the same day also showed no osseous metastases.  The  patient reviewed the biopsy results with his urologist and he has kindly been referred today for discussion of potential radiation treatment options.  He is not felt to be an ideal surgical candidate due to his multiple medical co-morbidities including CAD s/p MI and cardiac stenting, now on chronic anticoagulation, Type II DM, CVA and OSA.   PREVIOUS RADIATION THERAPY: No  PAST MEDICAL HISTORY:  Past Medical History:  Diagnosis Date   Anxiety    Arthritis    "they say it but I don't feel it" (11/11/2017)   Chronic lower back pain    Chronic pain of lower extremity, bilateral    COPD (chronic obstructive pulmonary disease) (HCC)    Coronary artery disease    Diabetic peripheral neuropathy (HCC)    Emphysema lung (HCC)    GERD (gastroesophageal reflux disease)    Headache    "monthly" (11/11/2017)   Hypercholesteremia    Hypertension    Macular degeneration, bilateral    Myocardial infarction ([place]) 09/15/2017   OSA on CPAP    Prostate cancer ([place])    Stroke ([place]) 09/27/2009 X 3   "left sided weakness;memory deficits since then" (11/11/2017)   Type II diabetes mellitus ([place])       PAST SURGICAL HISTORY: Past Surgical History:  Procedure Laterality Date   ABDOMINAL AORTOGRAM N/A 11/11/2017   Procedure: ABDOMINAL AORTOGRAM;  Surgeon: Lorretta Harp, MD;  Location: [facility];  Service: Cardiovascular;  Laterality: N/A;   CORONARY ANGIOPLASTY WITH  STENT PLACEMENT  09/15/2017   "2 stents"   FRACTURE SURGERY     LOWER EXTREMITY ANGIOGRAPHY Bilateral 11/11/2017   Procedure: LOWER EXTREMITY ANGIOGRAPHY;  Surgeon: Lorretta Harp, MD;  Location: [place] [facility];  Service: Cardiovascular;  Laterality: Bilateral;   ORIF RADIAL FRACTURE  03/28/2012   Procedure: OPEN REDUCTION INTERNAL FIXATION (ORIF) RADIAL FRACTURE;  Surgeon: Sanjuana Kava, MD;  Location: [facility];  Service: Orthopedics;  Laterality: Right;  ORIF Right Radial Shaft Fracture   PERIPHERAL  VASCULAR INTERVENTION  11/11/2017   PERIPHERAL VASCULAR INTERVENTION Right 11/11/2017   Procedure: PERIPHERAL VASCULAR INTERVENTION;  Surgeon: Lorretta Harp, MD;  Location: [place] [facility];  Service: Cardiovascular;  Laterality: Right;  SFA   WISDOM TOOTH EXTRACTION      FAMILY HISTORY:  Family History  Problem Relation Age of Onset   Cancer Mother    Bladder Cancer Father    Esophageal cancer Maternal Grandmother    Prostate cancer Neg Hx    Breast cancer Neg Hx    Colon cancer Neg Hx     SOCIAL HISTORY:  Social History   Socioeconomic History   Marital status: Single    Spouse name: Not on file   Number of children: Not on file   Years of education: Not on file   Highest education level: Not on file  Occupational History   Not on file  Social Needs   Financial resource strain: Not on file   Food insecurity    Worry: Not on file    Inability: Not on file   Transportation needs    Medical: Not on file    Non-medical: Not on file  Tobacco Use   Smoking status: Current Every Day Smoker    Packs/day: 0.50    Years: 52.00    Pack years: 26.00    Types: Cigarettes   Smokeless tobacco: Never Used  Substance and Sexual Activity   Alcohol use: Not Currently   Drug use: Not Currently    Types: LSD    Comment: 11/11/2017 "when I was a kid; 1970s"   Sexual activity: Not Currently  Lifestyle   Physical activity    Days per week: Not on file    Minutes per session: Not on file   Stress: Not on file  Relationships   Social connections    Talks on phone: Not on file    Gets together: Not on file    Attends religious service: Not on file    Active member of club or organization: Not on file    Attends meetings of clubs or organizations: Not on file    Relationship status: Not on file   Intimate partner violence    Fear of current or ex partner: Not on file    Emotionally abused: Not on file    Physically abused: Not on file    Forced  sexual activity: Not on file  Other Topics Concern   Not on file  Social History Narrative   Not on file    ALLERGIES: Amitriptyline, Shellfish allergy, and Tylenol [acetaminophen]  MEDICATIONS:  Current Outpatient Medications  Medication Sig Dispense Refill   aspirin EC 81 MG tablet Take 1 tablet by mouth 2 (two) times daily.      carvedilol (COREG) 6.25 MG tablet Take 1 tablet (6.25 mg total) by mouth 2 (two) times daily. 180 tablet 2   clonazePAM (KLONOPIN) 1 MG tablet Take 1 mg by mouth 2 (two) times daily as needed  for anxiety.      lisinopril (PRINIVIL,ZESTRIL) 10 MG tablet Take 1 tablet (10 mg total) by mouth daily. 90 tablet 2   metFORMIN (GLUCOPHAGE) 500 MG tablet Take 500 mg by mouth 2 (two) times daily with a meal.     pregabalin (LYRICA) 100 MG capsule Take 100 mg by mouth 3 (three) times daily.      PROAIR HFA 108 (90 Base) MCG/ACT inhaler Inhale 2 puffs into the lungs every 4 (four) hours as needed for wheezing or shortness of breath.   2   rosuvastatin (CRESTOR) 5 MG tablet Take 1 tablet (5 mg total) by mouth daily. 90 tablet 2   ticagrelor (BRILINTA) 90 MG TABS tablet Take 1 tablet (90 mg total) by mouth 2 (two) times daily. 180 tablet 2   tiZANidine (ZANAFLEX) 2 MG tablet Take 2 mg by mouth 3 (three) times daily.      No current facility-administered medications for this encounter.     REVIEW OF SYSTEMS:  On review of systems, the patient reports that he is doing well overall. He denies any chest pain, shortness of breath, cough, fevers, chills, night sweats, unintended weight changes. He denies any bowel disturbances, and denies nausea or vomiting. He denies any new musculoskeletal or joint aches or pains. His IPSS was 5, indicating mild urinary symptoms. He reports urinary urgency. His SHIM was 1, indicating he has severe erectile dysfunction, mostly due to inactivity and he reports that this is not a priority for him. He also reports constant abdominal pain,  which has been present the last 4 years, that he rates a 4/10 and unchanged recently. A complete review of systems is obtained and is otherwise negative.    PHYSICAL EXAM:  Wt Readings from Last 3 Encounters:  02/10/19 214 lb (97.1 kg)  01/13/19 215 lb (97.5 kg)  11/30/17 217 lb (98.4 kg)   Temp Readings from Last 3 Encounters:  01/13/19 (!) 97.5 F (36.4 C)  11/12/17 (!) 97.4 F (36.3 C)  03/29/12 98 F (36.7 C) (Oral)   BP Readings from Last 3 Encounters:  01/13/19 138/80  11/30/17 116/68  11/12/17 117/73   Pulse Readings from Last 3 Encounters:  01/13/19 85  11/30/17 89  11/12/17 67   Pain Assessment Pain Score: 4 (present x 4 years) Pain Frequency: Constant Pain Loc: Abdomen/10  Unable to assess due to telephone consult visit format.  KPS = 90  100 - Normal; no complaints; no evidence of disease. 90   - Able to carry on normal activity; minor signs or symptoms of disease. 80   - Normal activity with effort; some signs or symptoms of disease. 47   - Cares for self; unable to carry on normal activity or to do active work. 60   - Requires occasional assistance, but is able to care for most of his personal needs. 50   - Requires considerable assistance and frequent medical care. 60   - Disabled; requires special care and assistance. 33   - Severely disabled; hospital admission is indicated although death not imminent. 71   - Very sick; hospital admission necessary; active supportive treatment necessary. 10   - Moribund; fatal processes progressing rapidly. 0     - Dead  Karnofsky DA, Abelmann WH, Craver LS and Burchenal [facility] [phone]) The use of the nitrogen mustards in the palliative treatment of carcinoma: with particular reference to bronchogenic carcinoma Cancer 1 634-56  LABORATORY DATA:  Lab Results  Component Value Date  WBC 15.9 (H) 11/12/2017   HGB 13.7 11/12/2017   HCT 40.9 11/12/2017   MCV 88.1 11/12/2017   PLT 218 11/12/2017   Lab Results  Component  Value Date   NA 137 11/12/2017   K 4.1 11/12/2017   CL 107 11/12/2017   CO2 20 (L) 11/12/2017   Lab Results  Component Value Date   ALT 23 03/25/2012   AST 16 03/25/2012   ALKPHOS 121 (H) 03/25/2012   BILITOT 0.6 03/25/2012     RADIOGRAPHY: No results found.    IMPRESSION/PLAN:  This visit was conducted via Telephone to spare the patient unnecessary potential exposure in the healthcare setting during the current COVID-19 pandemic.  1. 63 y.o. gentleman with Stage T2a adenocarcinoma of the prostate with Gleason Score of 4+3, and PSA of 6.89. We discussed the patient's workup and outlined the nature of prostate cancer in this setting. The patient's T stage, Gleason's score, and PSA put him into the unfavorable intermediate risk group. Accordingly, he is eligible for a variety of potential treatment options including prostatectomy or ST-ADT in combination with either brachytherapy or 5.5 weeks of external radiation. He is not an ideal surgical candidate given his multiple medical co-morbidities, therefore, the recommendation is to proceed with ST-ADT now, followed by 5.5 weeks of daily prostate IMRT with curative intent, to begin approximately 8 weeks after the start of ADT. We discussed the available radiation techniques, and focused on the details of logistics and delivery. We discussed and outlined the risks, benefits, short and long-term effects associated with radiotherapy. We discussed the role of SpaceOAR in reducing the rectal toxicity associated with radiotherapy. We also detailed the role of ADT in the treatment of unfavorable, intermediate risk prostate cancer and outlined the associated side effects that could be expected with this therapy.  At the end of the conversation the patient remains undecided regarding his final treatment preference and would like to take more time to consider his options. He is leaning towards ST-ADT with prostate IMRT but also considering ADT alone which he  understands would not be curative and would also place him at higher risk for cardiac issues in the future.  He reports that he is in the process of obtaining a new cardiologist, whom he will also speak to regarding these options. He is interested in starting ADT now and he will contact us in the near future, if or when he chooses to pursue radiation treatment. We will share our discussion with Dr. Diona Fanti and look forward to following along in the care of this very nice gentleman.  Given current concerns for patient exposure during the COVID-19 pandemic, this encounter was conducted via telephone. The patient was notified in advance and was offered a MyChart meeting to allow for face to face communication but unfortunately reported that he did not have the appropriate resources/technology to support such a visit and instead preferred to proceed with telephone consult. The patient has given verbal consent for this type of encounter. The time spent during this encounter was 55 minutes. The attendants for this meeting include Tyler Pita MD, Ashlyn Bruning PA-C, Eric Brooks, and patient Eric Brooks. During the encounter, Tyler Pita MD, Ashlyn Bruning PA-C, and scribe, Wilburn Mylar were located at [place].  Patient Eric Brooks was located at home.    Nicholos Johns, PA-C    Tyler Pita, MD  [facility] Direct Dial: [phone]   Fax: [phone] .com  Skype   [email or web address]  This document serves as a record of services personally performed by Tyler Pita, MD and Freeman Caldron, PA-C. It was created on their behalf by Wilburn Mylar, a trained medical scribe. The creation of this record is based on the scribe's personal observations and the provider's statements to them. This document has been checked and approved by the attending provider.

## 2019-02-13 ENCOUNTER — Telehealth: Payer: Self-pay | Admitting: *Deleted

## 2019-02-13 DIAGNOSIS — C61 Malignant neoplasm of prostate: Secondary | ICD-10-CM | POA: Insufficient documentation

## 2019-02-13 NOTE — Telephone Encounter (Signed)
Called patient to inform of appt. for ADT on 03-06-19 - arrival time- 3:15 pm, spoke with patient's sgo- [name] and she is aware of this appt.

## 2019-02-14 ENCOUNTER — Telehealth: Payer: Self-pay | Admitting: Medical Oncology

## 2019-02-14 NOTE — Telephone Encounter (Addendum)
Spoke with Eric Brooks, to introduce myself as the prostate nurse navigator and discuss my role. I was unable to meet him 9/25, when he consulted with Dr. Tammi Klippel. He states the consult went well and was pleased with the telephone visit. He has chosen to start ADT while considering radiation. He states he has heart issues and COPD, so not sure if he should pursue radiation. He is scheduled for hormone injection 10/19 @3 :15 pm. His significant other Eric Brooks, is aware of appointment. I will follow up with him at the end of October regarding radiation decision. I encouraged him to call with questions or concerns. He voiced understanding.

## 2019-02-22 NOTE — Telephone Encounter (Signed)
Telemedicine with Dr. Gwenlyn Found  On 7/24; OV with Dr. Gwenlyn Found on 8/28

## 2019-03-06 ENCOUNTER — Encounter: Payer: Self-pay | Admitting: Medical Oncology

## 2019-03-08 ENCOUNTER — Other Ambulatory Visit: Payer: Self-pay | Admitting: Internal Medicine

## 2019-03-08 ENCOUNTER — Other Ambulatory Visit (HOSPITAL_COMMUNITY): Payer: Self-pay | Admitting: Internal Medicine

## 2019-03-08 DIAGNOSIS — I70219 Atherosclerosis of native arteries of extremities with intermittent claudication, unspecified extremity: Secondary | ICD-10-CM

## 2019-03-08 DIAGNOSIS — I25118 Atherosclerotic heart disease of native coronary artery with other forms of angina pectoris: Secondary | ICD-10-CM

## 2019-03-09 ENCOUNTER — Encounter: Payer: Self-pay | Admitting: Medical Oncology

## 2019-03-09 ENCOUNTER — Encounter: Payer: Self-pay | Admitting: Urology

## 2019-03-09 NOTE — Progress Notes (Signed)
Per notification from Cira Rue, RN, this patient has decided to move forward with ST-ADT concurrent with 5.5 weeks of EBRT. He started ADT with Firmagon 240 mg on 10/19, and will return 11/17 for Eligard.  Inbox request was sent to Romie Jumper on 03/09/19 to please move forward with coordinating fiducial marker and SpaceOAR gel placement with Dr. Diona Fanti in late Nov./early Dec., prior to [facility] in anticipation of beginning his radiation treatments around  05/01/19 (not sooner).  Nicholos Johns, MMS, PA-C [facility] [place]: [phone]  Fax: [phone]

## 2019-03-13 NOTE — Progress Notes (Signed)
Patient has decided on ST-ADT and radiation. He received Mills Koller 10/19 and will return 11/17 for Eligard with Dr. Alan Ripper office. I informed him, Enid Derry will call to schedule fiducial markers/SpaceOar gel. We discussed starting radiation about 8 weeks post Firmagon. He voiced understanding and I informed Ashlyn, of his decision.

## 2019-03-14 ENCOUNTER — Other Ambulatory Visit: Payer: Self-pay

## 2019-03-14 ENCOUNTER — Ambulatory Visit
Admission: RE | Admit: 2019-03-14 | Discharge: 2019-03-14 | Disposition: A | Discharge status: Home / Self Care | Payer: Medicaid Other | Source: Ambulatory Visit | Attending: Internal Medicine | Admitting: Internal Medicine

## 2019-03-14 DIAGNOSIS — I70219 Atherosclerosis of native arteries of extremities with intermittent claudication, unspecified extremity: Secondary | ICD-10-CM | POA: Diagnosis not present

## 2019-03-16 ENCOUNTER — Ambulatory Visit
Admission: RE | Admit: 2019-03-16 | Discharge: 2019-03-16 | Disposition: A | Discharge status: Home / Self Care | Payer: Medicaid Other | Source: Ambulatory Visit | Attending: Internal Medicine | Admitting: Internal Medicine

## 2019-03-16 ENCOUNTER — Encounter
Admission: RE | Admit: 2019-03-16 | Discharge: 2019-03-16 | Disposition: A | Discharge status: Home / Self Care | Payer: Medicaid Other | Source: Ambulatory Visit | Attending: Internal Medicine | Admitting: Internal Medicine

## 2019-03-16 ENCOUNTER — Other Ambulatory Visit: Payer: Self-pay

## 2019-03-16 DIAGNOSIS — I252 Old myocardial infarction: Secondary | ICD-10-CM | POA: Insufficient documentation

## 2019-03-16 DIAGNOSIS — I340 Nonrheumatic mitral (valve) insufficiency: Secondary | ICD-10-CM | POA: Diagnosis not present

## 2019-03-16 DIAGNOSIS — I10 Essential (primary) hypertension: Secondary | ICD-10-CM | POA: Diagnosis not present

## 2019-03-16 DIAGNOSIS — G4730 Sleep apnea, unspecified: Secondary | ICD-10-CM | POA: Insufficient documentation

## 2019-03-16 DIAGNOSIS — E119 Type 2 diabetes mellitus without complications: Secondary | ICD-10-CM | POA: Diagnosis not present

## 2019-03-16 DIAGNOSIS — Z8673 Personal history of transient ischemic attack (TIA), and cerebral infarction without residual deficits: Secondary | ICD-10-CM | POA: Insufficient documentation

## 2019-03-16 DIAGNOSIS — I25118 Atherosclerotic heart disease of native coronary artery with other forms of angina pectoris: Secondary | ICD-10-CM | POA: Diagnosis present

## 2019-03-16 MED ORDER — REGADENOSON 0.4 MG/5ML IV SOLN
0.4000 mg | Freq: Once | INTRAVENOUS | Status: AC
  Administered 2019-03-16: 0.4 mg via INTRAVENOUS
  Filled 2019-03-16: qty 5

## 2019-03-16 MED ORDER — TECHNETIUM TC 99M TETROFOSMIN IV KIT
30.0000 | PACK | Freq: Once | INTRAVENOUS | Status: AC | PRN
  Administered 2019-03-16: 30.782 via INTRAVENOUS

## 2019-03-16 MED ORDER — TECHNETIUM TC 99M TETROFOSMIN IV KIT
10.0000 | PACK | Freq: Once | INTRAVENOUS | Status: AC | PRN
  Administered 2019-03-16: 11.578 via INTRAVENOUS

## 2019-03-16 NOTE — Progress Notes (Signed)
*  PRELIMINARY RESULTS* Echocardiogram 2D Echocardiogram has been performed.  Eric Brooks 03/16/2019, 11:35 AM

## 2019-03-17 LAB — NM MYOCAR MULTI W/SPECT W/WALL MOTION / EF
Estimated workload: 1 METS
Exercise duration (min): 1 min
Exercise duration (sec): 2 s
LV dias vol: 145 mL (ref 62–150)
LV sys vol: 80 mL
Peak HR: 96 {beats}/min
Percent HR: 60 %
Rest HR: 70 {beats}/min
SDS: 0
SRS: 27
SSS: 24
TID: 1

## 2019-04-11 ENCOUNTER — Telehealth: Payer: Self-pay | Admitting: *Deleted

## 2019-04-11 NOTE — Telephone Encounter (Signed)
CALLED PATIENT TO ASK QUESTION, LVM FOR A RETURN CALL 

## 2019-05-08 ENCOUNTER — Ambulatory Visit
Admission: EM | Admit: 2019-05-08 | Discharge: 2019-05-08 | Disposition: A | Discharge status: Home / Self Care | Payer: Medicaid Other | Attending: Family Medicine | Admitting: Family Medicine

## 2019-05-08 ENCOUNTER — Encounter: Payer: Self-pay | Admitting: Medical Oncology

## 2019-05-08 ENCOUNTER — Other Ambulatory Visit: Payer: Self-pay

## 2019-05-08 DIAGNOSIS — K047 Periapical abscess without sinus: Secondary | ICD-10-CM | POA: Diagnosis not present

## 2019-05-08 MED ORDER — OXYCODONE HCL 5 MG PO TABS: 5.0000 mg | ORAL_TABLET | Freq: Three times a day (TID) | ORAL | 0 refills | Status: DC | PRN

## 2019-05-08 MED ORDER — AMOXICILLIN-POT CLAVULANATE 875-125 MG PO TABS: 1.0000 | ORAL_TABLET | Freq: Two times a day (BID) | ORAL | 0 refills | Status: DC

## 2019-05-08 NOTE — ED Provider Notes (Signed)
[facility]    CSN: [id number] Arrival date & time: 05/08/19  1541  History   Chief Complaint Chief Complaint  Patient presents with  . Oral Pain   HPI  63 year old male presents with the above complaint.  Patient reports that his left front tooth (upper) has been bothering him for the past 3 to 4 days.  He states that he felt like it was loose.  Today his dog bumped him in the nose and he had severe pain.  Patient has examined the tooth and has noted a collection of pus/abscess in the gum above the tooth.  It is currently draining.  He rates his pain as 9/10 in severity.  No medications or interventions tried.  Exacerbated by touch.  No relieving factors.  No other complaints.  PMH, Surgical Hx, Family Hx, Social History reviewed and updated as below.  Past Medical History:  Diagnosis Date  . Anxiety   . Arthritis    "they say it but I don't feel it" (11/11/2017)  . Chronic lower back pain   . Chronic pain of lower extremity, bilateral   . COPD (chronic obstructive pulmonary disease) ([place])   . Coronary artery disease   . Diabetic peripheral neuropathy ([place])   . Emphysema lung ([place])   . GERD (gastroesophageal reflux disease)   . Headache    "monthly" (11/11/2017)  . Hypercholesteremia   . Hypertension   . Macular degeneration, bilateral   . Myocardial infarction ([place]) 09/15/2017  . OSA on CPAP   . Prostate cancer ([place])   . Stroke ([place]) 09/27/2009 X 3   "left sided weakness;memory deficits since then" (11/11/2017)  . Type II diabetes mellitus [facility])     Patient Active Problem List   Diagnosis Date Noted  . Malignant neoplasm of prostate ([place]) 02/13/2019  . Claudication in peripheral vascular disease ([place]) 11/11/2017  . Tobacco abuse 10/20/2017  . COPD (chronic obstructive pulmonary disease) ([place]) 10/20/2017  . Hyperlipidemia 10/20/2017  . Essential hypertension 10/20/2017  . Type 2 diabetes mellitus with complication, without long-term current use of  insulin ([place]) 10/20/2017  . Peripheral arterial disease ([place]) 10/20/2017  . Coronary artery disease 10/20/2017    Past Surgical History:  Procedure Laterality Date  . ABDOMINAL AORTOGRAM N/A 11/11/2017   Procedure: ABDOMINAL AORTOGRAM;  Surgeon: Lorretta Harp, MD;  Location: [facility];  Service: Cardiovascular;  Laterality: N/A;  . CORONARY ANGIOPLASTY WITH STENT PLACEMENT  09/15/2017   "2 stents"  . FRACTURE SURGERY    . LOWER EXTREMITY ANGIOGRAPHY Bilateral 11/11/2017   Procedure: LOWER EXTREMITY ANGIOGRAPHY;  Surgeon: Lorretta Harp, MD;  Location: [facility];  Service: Cardiovascular;  Laterality: Bilateral;  . ORIF RADIAL FRACTURE  03/28/2012   Procedure: OPEN REDUCTION INTERNAL FIXATION (ORIF) RADIAL FRACTURE;  Surgeon: Sanjuana Kava, MD;  Location: [facility];  Service: Orthopedics;  Laterality: Right;  ORIF Right Radial Shaft Fracture  . PERIPHERAL VASCULAR INTERVENTION  11/11/2017  . PERIPHERAL VASCULAR INTERVENTION Right 11/11/2017   Procedure: PERIPHERAL VASCULAR INTERVENTION;  Surgeon: Lorretta Harp, MD;  Location: [facility];  Service: Cardiovascular;  Laterality: Right;  SFA  . WISDOM TOOTH EXTRACTION         Home Medications    Prior to Admission medications   Medication Sig Start Date End Date Taking? Authorizing Provider  amoxicillin-clavulanate (AUGMENTIN) 875-125 MG tablet Take 1 tablet by mouth every 12 (twelve) hours. 05/08/19   Coral Spikes, DO  aspirin EC 81 MG  tablet Take 1 tablet by mouth 2 (two) times daily.  08/14/16   [provider]  carvedilol (COREG) 6.25 MG tablet Take 1 tablet (6.25 mg total) by mouth 2 (two) times daily. 05/13/18   Lorretta Harp, MD  clonazePAM (KLONOPIN) 1 MG tablet Take 1 mg by mouth 2 (two) times daily as needed for anxiety.     [provider]  lisinopril (PRINIVIL,ZESTRIL) 10 MG tablet Take 1 tablet (10 mg total) by mouth daily. 05/13/18   Lorretta Harp, MD  metFORMIN  (GLUCOPHAGE) 500 MG tablet Take 500 mg by mouth 2 (two) times daily with a meal.    [provider]  oxyCODONE (ROXICODONE) 5 MG immediate release tablet Take 1 tablet (5 mg total) by mouth every 8 (eight) hours as needed for severe pain. 05/08/19   Coral Spikes, DO  PROAIR HFA 108 [phone] Base) MCG/ACT inhaler Inhale 2 puffs into the lungs every 4 (four) hours as needed for wheezing or shortness of breath.  09/24/17   [provider]  rosuvastatin (CRESTOR) 5 MG tablet Take 1 tablet (5 mg total) by mouth daily. 05/13/18   Lorretta Harp, MD  tiZANidine (ZANAFLEX) 2 MG tablet Take 2 mg by mouth 3 (three) times daily.     [provider]  pregabalin (LYRICA) 100 MG capsule Take 100 mg by mouth 3 (three) times daily.   05/08/19  [provider]    Family History Family History  Problem Relation Age of Onset  . Cancer Mother   . Bladder Cancer Father   . Esophageal cancer Maternal Grandmother   . Prostate cancer Neg Hx   . Breast cancer Neg Hx   . Colon cancer Neg Hx     Social History Social History   Tobacco Use  . Smoking status: Current Every Day Smoker    Packs/day: 0.50    Years: 52.00    Pack years: 26.00    Types: Cigarettes  . Smokeless tobacco: Never Used  Substance Use Topics  . Alcohol use: Not Currently  . Drug use: Not Currently    Types: LSD    Comment: 11/11/2017 "when I was a kid; 1970s"     Allergies   Amitriptyline, Shellfish allergy, and Tylenol [acetaminophen]   Review of Systems Review of Systems  Constitutional: Negative.   HENT: Positive for dental problem.    Physical Exam Triage Vital Signs ED Triage Vitals  Enc Vitals Group     BP 05/08/19 1552 131/79     Pulse Rate 05/08/19 1552 89     Resp 05/08/19 1552 20     Temp 05/08/19 1552 98 F (36.7 C)     Temp Source 05/08/19 1552 Oral     SpO2 05/08/19 1552 97 %     Weight 05/08/19 1555 215 lb (97.5 kg)     Height 05/08/19 1555 5\' 8"  (1.727 m)     Head  Circumference --      Peak Flow --      Pain Score 05/08/19 1555 9     Pain Loc --      Pain Edu? --      Excl. in [place]? --    No data found.  Updated Vital Signs BP 131/79 (BP Location: Left Arm)   Pulse 89   Temp 98 F (36.7 C) (Oral)   Resp 20   Ht 5\' 8"  (1.727 m)   Wt 97.5 kg   SpO2 97%   BMI 32.69  kg/m   Visual Acuity Right Eye Distance:   Left Eye Distance:   Bilateral Distance:    Right Eye Near:   Left Eye Near:    Bilateral Near:     Physical Exam Vitals and nursing note reviewed.  Constitutional:      General: He is not in acute distress.    Appearance: Normal appearance. He is obese. He is not ill-appearing.  HENT:     Head: Normocephalic and atraumatic.     Mouth/Throat:      Comments: Collection of pus noted of the gum above the labeled tooth. Eyes:     General:        Right eye: No discharge.        Left eye: No discharge.     Conjunctiva/sclera: Conjunctivae normal.  Cardiovascular:     Rate and Rhythm: Normal rate and regular rhythm.     Heart sounds: No murmur.  Pulmonary:     Effort: Pulmonary effort is normal. No respiratory distress.     Breath sounds: No wheezing.  Neurological:     Mental Status: He is alert.  Psychiatric:        Mood and Affect: Mood normal.        Behavior: Behavior normal.      UC Treatments / Results  Labs (all labs ordered are listed, but only abnormal results are displayed) Labs Reviewed - No data to display  EKG   Radiology No results found.  Procedures Procedures (including critical care time)  Medications Ordered in UC Medications - No data to display  Initial Impression / Assessment and Plan / UC Course  I have reviewed the triage vital signs and the nursing notes.  Pertinent labs & imaging results that were available during my care of the patient were reviewed by me and considered in my medical decision making (see chart for details).    63 year old male presents with dental abscess.   Treating with Augmentin.  Oxycodone as needed for pain.  [place] controlled substance database reviewed.  No concern for abuse at this time.  Final Clinical Impressions(s) / UC Diagnoses   Final diagnoses:  Dental abscess     Discharge Instructions     See dentist ASAP.  Medications as directed.  Take care  Dr. Lacinda Axon    ED Prescriptions    Medication Sig Dispense Auth. Provider   amoxicillin-clavulanate (AUGMENTIN) 875-125 MG tablet Take 1 tablet by mouth every 12 (twelve) hours. 20 tablet Alichia Alridge G, DO   oxyCODONE (ROXICODONE) 5 MG immediate release tablet Take 1 tablet (5 mg total) by mouth every 8 (eight) hours as needed for severe pain. 10 tablet Thersa Salt G, DO     I have reviewed the PDMP during this encounter.   Coral Spikes, [place] 05/08/19 1630

## 2019-05-08 NOTE — ED Triage Notes (Signed)
Pt reports painful "abscess" to upper gum

## 2019-05-08 NOTE — Discharge Instructions (Signed)
See dentist ASAP.  Medications as directed.  Take care  Dr. Lacinda Axon

## 2019-05-08 NOTE — Progress Notes (Signed)
Patient started ADT 10/19 and has requested to delay radiation due to [place]. Dr. Tammi Klippel notified.

## 2019-07-25 ENCOUNTER — Other Ambulatory Visit: Payer: Self-pay

## 2019-07-25 ENCOUNTER — Ambulatory Visit: Payer: Medicaid Other | Admitting: Podiatry

## 2019-07-25 DIAGNOSIS — L600 Ingrowing nail: Secondary | ICD-10-CM

## 2019-07-26 ENCOUNTER — Encounter: Payer: Self-pay | Admitting: Podiatry

## 2019-07-26 NOTE — Progress Notes (Signed)
Subjective:  Patient ID: Eric Brooks, male    DOB: 1955/05/25,  MRN: [id number]  Chief Complaint  Patient presents with  . Nail Problem    pt is here for a possible ingrown of the right big toenail medial to lateral side, pain has been going on for multiple months, pt has tried self debridement, but not much has helped.    64 y.o. male presents with the above complaint.  Patient presents with ingrown of the right lateral border and left medial border of bilateral hallux.  Patient states been going for about a month and has progressively gotten worse.  Pain is 10 out of 10 on the pain scale.  Patient states it has not been oozing but has been causing him a lot of pain.  Patient has tried self debridement but has not helped much.  Patient is a diabetic with unknown A1c.  I explained to the patient that if he attempted to remove this there is a high risk that he will end up losing/amputation of the toe.  Patient states understanding.   Review of Systems: Negative except as noted in the HPI. Denies N/V/F/Ch.  Past Medical History:  Diagnosis Date  . Anxiety   . Arthritis    "they say it but I don't feel it" (11/11/2017)  . Chronic lower back pain   . Chronic pain of lower extremity, bilateral   . COPD (chronic obstructive pulmonary disease) ([place])   . Coronary artery disease   . Diabetic peripheral neuropathy ([place])   . Emphysema lung ([place])   . GERD (gastroesophageal reflux disease)   . Headache    "monthly" (11/11/2017)  . Hypercholesteremia   . Hypertension   . Macular degeneration, bilateral   . Myocardial infarction ([place]) 09/15/2017  . OSA on CPAP   . Prostate cancer ([place])   . Stroke ([place]) 09/27/2009 X 3   "left sided weakness;memory deficits since then" (11/11/2017)  . Type II diabetes mellitus ([facility])     Current Outpatient Medications:  .  amoxicillin-clavulanate (AUGMENTIN) 875-125 MG tablet, Take 1 tablet by mouth every 12 (twelve) hours., Disp: 20 tablet, Rfl: 0 .   aspirin EC 81 MG tablet, Take 1 tablet by mouth 2 (two) times daily. , Disp: , Rfl:  .  carvedilol (COREG) 6.25 MG tablet, Take 1 tablet (6.25 mg total) by mouth 2 (two) times daily., Disp: 180 tablet, Rfl: 2 .  clonazePAM (KLONOPIN) 1 MG tablet, Take 1 mg by mouth 2 (two) times daily as needed for anxiety. , Disp: , Rfl:  .  lisinopril (PRINIVIL,ZESTRIL) 10 MG tablet, Take 1 tablet (10 mg total) by mouth daily., Disp: 90 tablet, Rfl: 2 .  metFORMIN (GLUCOPHAGE) 500 MG tablet, Take 500 mg by mouth 2 (two) times daily with a meal., Disp: , Rfl:  .  oxyCODONE (ROXICODONE) 5 MG immediate release tablet, Take 1 tablet (5 mg total) by mouth every 8 (eight) hours as needed for severe pain., Disp: 10 tablet, Rfl: 0 .  PROAIR HFA 108 (90 Base) MCG/ACT inhaler, Inhale 2 puffs into the lungs every 4 (four) hours as needed for wheezing or shortness of breath. , Disp: , Rfl: 2 .  rosuvastatin (CRESTOR) 5 MG tablet, Take 1 tablet (5 mg total) by mouth daily., Disp: 90 tablet, Rfl: 2 .  tiZANidine (ZANAFLEX) 2 MG tablet, Take 2 mg by mouth 3 (three) times daily. , Disp: , Rfl:   Social History   Tobacco Use  Smoking Status Current Every Day  Smoker  . Packs/day: 0.50  . Years: 52.00  . Pack years: 26.00  . Types: Cigarettes  Smokeless Tobacco Never Used    Allergies  Allergen Reactions  . Amitriptyline Anaphylaxis    Pt reports elavil allergy  . Shellfish Allergy Anaphylaxis  . Tylenol [Acetaminophen] Shortness Of Breath and Rash   Objective:  There were no vitals filed for this visit. There is no height or weight on file to calculate BMI. Constitutional Well developed. Well nourished.  Vascular Dorsalis pedis pulses palpable bilaterally. Posterior tibial pulses palpable bilaterally. Capillary refill normal to all digits.  No cyanosis or clubbing noted. Pedal hair growth normal.  Neurologic Normal speech. Oriented to person, place, and time. Epicritic sensation to light touch grossly present  bilaterally.  Dermatologic Painful ingrowing nail at Right lateral border and left medial border nail borders of the hallux nail bilaterally. No other open wounds. No skin lesions.  Orthopedic: Normal joint ROM without pain or crepitus bilaterally. No visible deformities. No bony tenderness.   Radiographs: None Assessment:   1. Ingrown toenail of right foot   2. Ingrown left big toenail    Plan:  Patient was evaluated and treated and all questions answered.  Ingrown Nail, bilaterally -Patient elects to proceed with minor surgery to remove ingrown toenail removal today. Consent reviewed and signed by patient. -Ingrown nail excised. See procedure note. -Educated on post-procedure care including soaking. Written instructions provided and reviewed. -Patient to follow up in 2 weeks for nail check. -Given that patient is a diabetic with unknown A1c, patient is a high risk for losing his great toe.  Given the amount of pain that patient is experiencing he would like to have the ingrown removed despite the risks.  He states that he is well prepared for an amputation if needed. -Given that I am able to palpate his pulses I feel that he should be able to heal the wound from the incision.  However I did discussed extensively with the patient about not healing wound versus amputation.  Patient states understanding would like to proceed with the surgical procedure despite the risks.  Procedure: Excision of Ingrown Toenail Location: Bilateral 1st toe Right lateral border and left medial border nail borders. Anesthesia: Lidocaine 1% plain; 1.5 mL and Marcaine 0.5% plain; 1.5 mL, digital block. Skin Prep: Betadine. Dressing: Silvadene; telfa; dry, sterile, compression dressing. Technique: Following skin prep, the toe was exsanguinated and a tourniquet was secured at the base of the toe. The affected nail border was freed, split with a nail splitter, and excised. Chemical matrixectomy was then performed  with phenol and irrigated out with alcohol. The tourniquet was then removed and sterile dressing applied. Disposition: Patient tolerated procedure well. Patient to return in 2 weeks for follow-up.   No follow-ups on file.

## 2019-08-02 ENCOUNTER — Other Ambulatory Visit: Payer: Self-pay

## 2019-08-02 ENCOUNTER — Encounter: Payer: Self-pay | Admitting: Emergency Medicine

## 2019-08-02 ENCOUNTER — Emergency Department
Admission: EM | Admit: 2019-08-02 | Discharge: 2019-08-02 | Discharge status: Against Medical Advice | Payer: Medicaid Other

## 2019-08-02 NOTE — ED Triage Notes (Signed)
Pt to ER states he has a stent placed in left leg a year ago and since that time has had pain in his insertion site.  States for last several months he has had more pain in leg and having numbness in the leg.  States pain and numbness starts as a burning.  Pt denies change in pain and numbness, except it is getting gradually worse.

## 2019-08-02 NOTE — ED Notes (Signed)
AS triage completed, pt asking for wait times.  This RN informed pt that it was hard to give him a definite time.  Pt states "I can't go to another hospital can I".  This RN informed pt that we could not hold him in the hospital against his will.  This RN informed pt that there were patients that had been waiting as long as three hours and that his wait might be longer or shorter and there was no way to know.  Pt states to this RN "I'm not fucking waiting here three hours, y'all can charge me like you did the last time I walked out."

## 2019-08-08 ENCOUNTER — Other Ambulatory Visit: Payer: Self-pay

## 2019-08-08 ENCOUNTER — Ambulatory Visit: Payer: Medicaid Other | Admitting: Podiatry

## 2019-08-08 DIAGNOSIS — L600 Ingrowing nail: Secondary | ICD-10-CM | POA: Diagnosis not present

## 2019-08-09 ENCOUNTER — Encounter: Payer: Self-pay | Admitting: Podiatry

## 2019-08-09 NOTE — Progress Notes (Signed)
Subjective: Eric Brooks is a 64 y.o.  male returns to office today for follow up evaluation after having bilateral Hallux right lateral border and left medial border border nail avulsion performed. Patient has been soaking using epsom salt and applying topical antibiotic covered with bandaid daily. Patient denies fevers, chills, nausea, vomiting. Denies any calf pain, chest pain, SOB.   Objective:  Vitals: Reviewed  General: Well developed, nourished, in no acute distress, alert and oriented x3   Dermatology: Skin is warm, dry and supple bilateral.  Right lateral border and left medial border hallux nail border appears to be clean, dry, with mild granular tissue and surrounding scab. There is no surrounding erythema, edema, drainage/purulence. The remaining nails appear unremarkable at this time. There are no other lesions or other signs of infection present.  Neurovascular status: Intact. No lower extremity swelling; No pain with calf compression bilateral.  Musculoskeletal: Decreased tenderness to palpation of the right lateral border and left medial border hallux nail fold(s). Muscular strength within normal limits bilateral.   Assesement and Plan: S/p partial nail avulsion, doing well.   -Continue soaking in epsom salts twice a day followed by antibiotic ointment and a band-aid. Can leave uncovered at night. Continue this until completely healed.  -If the area has not healed in 2 weeks, call the office for follow-up appointment, or sooner if any problems arise.  -Monitor for any signs/symptoms of infection. Call the office immediately if any occur or go directly to the emergency room. Call with any questions/concerns.  Boneta Lucks, DPM

## 2019-08-21 ENCOUNTER — Ambulatory Visit (INDEPENDENT_AMBULATORY_CARE_PROVIDER_SITE_OTHER): Payer: Medicaid Other | Admitting: Vascular Surgery

## 2019-08-21 ENCOUNTER — Encounter (INDEPENDENT_AMBULATORY_CARE_PROVIDER_SITE_OTHER): Payer: Self-pay | Admitting: Vascular Surgery

## 2019-08-21 ENCOUNTER — Other Ambulatory Visit: Payer: Self-pay

## 2019-08-21 VITALS — BP 116/81 | HR 84 | Ht 69.0 in | Wt 218.0 lb

## 2019-08-21 DIAGNOSIS — J449 Chronic obstructive pulmonary disease, unspecified: Secondary | ICD-10-CM | POA: Diagnosis not present

## 2019-08-21 DIAGNOSIS — I10 Essential (primary) hypertension: Secondary | ICD-10-CM | POA: Diagnosis not present

## 2019-08-21 DIAGNOSIS — I739 Peripheral vascular disease, unspecified: Secondary | ICD-10-CM | POA: Diagnosis not present

## 2019-08-21 DIAGNOSIS — I2510 Atherosclerotic heart disease of native coronary artery without angina pectoris: Secondary | ICD-10-CM

## 2019-08-21 DIAGNOSIS — E118 Type 2 diabetes mellitus with unspecified complications: Secondary | ICD-10-CM

## 2019-08-21 NOTE — Progress Notes (Signed)
MRN : [id number]  Eric Brooks is a 64 y.o. (01/08/1956) male who presents with chief complaint of No chief complaint on file. Marland Kitchen  History of Present Illness:   The patient is seen for evaluation of painful lower extremities. Patient notes the pain is variable and not always associated with activity.  The pain is somewhat consistent day to day occurring on most days. The patient notes the pain also occurs with standing and routinely seems worse as the day wears on. The pain has been progressive over the past several years. The patient states these symptoms are causing  a profound negative impact on quality of life and daily activities.  There have been multiple vascular interventions in the past  The patient denies rest pain or dangling of an extremity off the side of the bed during the night for relief. No open wounds or sores at this time. No history of DVT or phlebitis.  There is a  history of back problems and DJD of the lumbar and sacral spine.    No outpatient medications have been marked as taking for the 08/21/19 encounter (Appointment) with Delana Meyer, Dolores Lory, MD.    Past Medical History:  Diagnosis Date  . Anxiety   . Arthritis    "they say it but I don't feel it" (11/11/2017)  . Chronic lower back pain   . Chronic pain of lower extremity, bilateral   . COPD (chronic obstructive pulmonary disease) ([place])   . Coronary artery disease   . Diabetic peripheral neuropathy ([place])   . Emphysema lung ([place])   . GERD (gastroesophageal reflux disease)   . Headache    "monthly" (11/11/2017)  . Hypercholesteremia   . Hypertension   . Macular degeneration, bilateral   . Myocardial infarction ([place]) 09/15/2017  . OSA on CPAP   . Prostate cancer ([place])   . Stroke ([place]) 09/27/2009 X 3   "left sided weakness;memory deficits since then" (11/11/2017)  . Type II diabetes mellitus ([place])     Past Surgical History:  Procedure Laterality Date  . ABDOMINAL AORTOGRAM N/A 11/11/2017   Procedure: ABDOMINAL AORTOGRAM;  Surgeon: Lorretta Harp, MD;  Location: [facility];  Service: Cardiovascular;  Laterality: N/A;  . CORONARY ANGIOPLASTY WITH STENT PLACEMENT  09/15/2017   "2 stents"  . FRACTURE SURGERY    . LOWER EXTREMITY ANGIOGRAPHY Bilateral 11/11/2017   Procedure: LOWER EXTREMITY ANGIOGRAPHY;  Surgeon: Lorretta Harp, MD;  Location: [facility];  Service: Cardiovascular;  Laterality: Bilateral;  . ORIF RADIAL FRACTURE  03/28/2012   Procedure: OPEN REDUCTION INTERNAL FIXATION (ORIF) RADIAL FRACTURE;  Surgeon: Sanjuana Kava, MD;  Location: [facility];  Service: Orthopedics;  Laterality: Right;  ORIF Right Radial Shaft Fracture  . PERIPHERAL VASCULAR INTERVENTION  11/11/2017  . PERIPHERAL VASCULAR INTERVENTION Right 11/11/2017   Procedure: PERIPHERAL VASCULAR INTERVENTION;  Surgeon: Lorretta Harp, MD;  Location: [facility];  Service: Cardiovascular;  Laterality: Right;  SFA  . WISDOM TOOTH EXTRACTION      Social History Social History   Tobacco Use  . Smoking status: Current Every Day Smoker    Packs/day: 0.50    Years: 52.00    Pack years: 26.00    Types: Cigarettes  . Smokeless tobacco: Never Used  Substance Use Topics  . Alcohol use: Not Currently  . Drug use: Not Currently    Types: LSD    Comment: 11/11/2017 "when I was a kid; 1970s"    Family History  Family History  Problem Relation Age of Onset  . Cancer Mother   . Bladder Cancer Father   . Esophageal cancer Maternal Grandmother   . Prostate cancer Neg Hx   . Breast cancer Neg Hx   . Colon cancer Neg Hx   No family history of bleeding/clotting disorders, porphyria or autoimmune disease   Allergies  Allergen Reactions  . Amitriptyline Anaphylaxis    Pt reports elavil allergy  . Shellfish Allergy Anaphylaxis  . Tylenol [Acetaminophen] Shortness Of Breath and Rash     REVIEW OF SYSTEMS (Negative unless checked)  Constitutional: [] Weight loss  [] Fever   [] Chills Cardiac: [] Chest pain   [] Chest pressure   [] Palpitations   [] Shortness of breath when laying flat   [] Shortness of breath with exertion. Vascular:  [x] Pain in legs with walking   [x] Pain in legs at rest  [] History of DVT   [] Phlebitis   [] Swelling in legs   [] Varicose veins   [] Non-healing ulcers Pulmonary:   [] Uses home oxygen   [] Productive cough   [] Hemoptysis   [] Wheeze  [] COPD   [] Asthma Neurologic:  [] Dizziness   [] Seizures   [] History of stroke   [] History of TIA  [] Aphasia   [] Vissual changes   [] Weakness or numbness in arm   [x] Weakness or numbness in leg Musculoskeletal:   [] Joint swelling   [] Joint pain   [] Low back pain Hematologic:  [] Easy bruising  [] Easy bleeding   [] Hypercoagulable state   [] Anemic Gastrointestinal:  [] Diarrhea   [] Vomiting  [] Gastroesophageal reflux/heartburn   [] Difficulty swallowing. Genitourinary:  [] Chronic kidney disease   [] Difficult urination  [] Frequent urination   [] Blood in urine Skin:  [] Rashes   [] Ulcers  Psychological:  [] History of anxiety   []  History of major depression.  Physical Examination  There were no vitals filed for this visit. There is no height or weight on file to calculate BMI. Gen: WD/WN, NAD Head: [place]/AT, No temporalis wasting.  Ear/Nose/Throat: Hearing grossly intact, nares w/o erythema or drainage, poor dentition Eyes: PER, EOMI, sclera nonicteric.  Neck: Supple, no masses.  No bruit or JVD.  Pulmonary:  Good air movement, clear to auscultation bilaterally, no use of accessory muscles.  Cardiac: RRR, normal S1, S2, no Murmurs. Vascular:  Vessel Right Left  Radial Palpable Palpable  PT Not Palpable Not Palpable  DP Not Palpable Not Palpable  Gastrointestinal: soft, non-distended. No guarding/no peritoneal signs.  Musculoskeletal: M/S 4/5 legs 5/5 arms.  No deformity or atrophy.  Neurologic: CN 2-12 intact. Pain and light touch intact in extremities.  Symmetrical.  Speech is fluent. Motor exam as listed  above. Psychiatric: Judgment intact, Mood & affect appropriate for pt's clinical situation.    CBC Lab Results  Component Value Date   WBC 15.9 (H) 11/12/2017   HGB 13.7 11/12/2017   HCT 40.9 11/12/2017   MCV 88.1 11/12/2017   PLT 218 11/12/2017    BMET    Component Value Date/Time   NA 137 11/12/2017 0213   NA 140 11/02/2017 1425   K 4.1 11/12/2017 0213   CL 107 11/12/2017 0213   CO2 20 (L) 11/12/2017 0213   GLUCOSE 197 (H) 11/12/2017 0213   BUN 12 11/12/2017 0213   BUN 12 11/02/2017 1425   CREATININE 0.83 11/12/2017 0213   CALCIUM 8.3 (L) 11/12/2017 0213   GFRNONAA >60 11/12/2017 0213   GFRAA >60 11/12/2017 0213   CrCl cannot be calculated (Patient's most recent lab result is older than the maximum 21 days allowed.).  COAG  Lab Results  Component Value Date   INR 1.0 11/02/2017    Radiology No results found.   Assessment/Plan 1. Peripheral arterial disease (HCC)  Recommend:  The patient has atypical pain symptoms for pure atherosclerotic disease. However, on physical exam there is evidence of mixed venous and arterial disease, given the diminished pulses of the legs.  CT angiogram is ordered.  I suspect the patient is c/o pseudoclaudication.  Pending review of the CT angiogram he may benefit from evaluation of his LS spine which I defer to the primary service.  The patient should continue walking and begin a more formal exercise program. The patient should continue his antiplatelet therapy and aggressive treatment of the lipid abnormalities.  The patient should begin wearing graduated compression socks 15-20 mmHg strength to control edema.   - CT ANGIO AO+BIFEM W & OR WO CONTRAST; Future  2. Essential hypertension Continue antihypertensive medications as already ordered, these medications have been reviewed and there are no changes at this time.   3. Coronary artery disease involving native coronary artery of native heart without angina  pectoris Continue cardiac and antihypertensive medications as already ordered and reviewed, no changes at this time.  Continue statin as ordered and reviewed, no changes at this time  Nitrates PRN for chest pain   4. Chronic obstructive pulmonary disease, unspecified COPD type ([place]) Continue pulmonary medications and aerosols as already ordered, these medications have been reviewed and there are no changes at this time.    5. Type 2 diabetes mellitus with complication, without long-term current use of insulin (HCC) Continue hypoglycemic medications as already ordered, these medications have been reviewed and there are no changes at this time.  Hgb A1C to be monitored as already arranged by primary service   Hortencia Pilar, MD  08/21/2019 12:57 PM

## 2019-08-24 ENCOUNTER — Encounter (INDEPENDENT_AMBULATORY_CARE_PROVIDER_SITE_OTHER): Payer: Self-pay | Admitting: Vascular Surgery

## 2019-08-28 ENCOUNTER — Telehealth (INDEPENDENT_AMBULATORY_CARE_PROVIDER_SITE_OTHER): Payer: Self-pay | Admitting: Vascular Surgery

## 2019-09-08 ENCOUNTER — Other Ambulatory Visit: Payer: Self-pay

## 2019-09-08 ENCOUNTER — Ambulatory Visit
Admission: RE | Admit: 2019-09-08 | Discharge: 2019-09-08 | Disposition: A | Discharge status: Home / Self Care | Payer: Medicaid Other | Source: Ambulatory Visit | Attending: Vascular Surgery | Admitting: Vascular Surgery

## 2019-09-08 DIAGNOSIS — I739 Peripheral vascular disease, unspecified: Secondary | ICD-10-CM | POA: Insufficient documentation

## 2019-09-08 LAB — POCT I-STAT CREATININE: Creatinine, Ser: 0.7 mg/dL (ref 0.61–1.24)

## 2019-09-08 MED ORDER — IOHEXOL 350 MG/ML SOLN
125.0000 mL | Freq: Once | INTRAVENOUS | Status: AC | PRN
  Administered 2019-09-08: 125 mL via INTRAVENOUS

## 2019-09-21 ENCOUNTER — Ambulatory Visit (INDEPENDENT_AMBULATORY_CARE_PROVIDER_SITE_OTHER): Payer: Medicaid Other | Admitting: Vascular Surgery

## 2019-09-21 ENCOUNTER — Encounter (INDEPENDENT_AMBULATORY_CARE_PROVIDER_SITE_OTHER): Payer: Self-pay | Admitting: Vascular Surgery

## 2019-09-21 ENCOUNTER — Other Ambulatory Visit: Payer: Self-pay

## 2019-09-21 VITALS — BP 160/72 | HR 94 | Resp 16 | Wt 216.0 lb

## 2019-09-21 DIAGNOSIS — I70213 Atherosclerosis of native arteries of extremities with intermittent claudication, bilateral legs: Secondary | ICD-10-CM | POA: Diagnosis not present

## 2019-09-21 DIAGNOSIS — I2510 Atherosclerotic heart disease of native coronary artery without angina pectoris: Secondary | ICD-10-CM | POA: Diagnosis not present

## 2019-09-21 DIAGNOSIS — I10 Essential (primary) hypertension: Secondary | ICD-10-CM | POA: Diagnosis not present

## 2019-09-21 DIAGNOSIS — J449 Chronic obstructive pulmonary disease, unspecified: Secondary | ICD-10-CM

## 2019-09-21 DIAGNOSIS — E118 Type 2 diabetes mellitus with unspecified complications: Secondary | ICD-10-CM

## 2019-09-25 ENCOUNTER — Encounter (INDEPENDENT_AMBULATORY_CARE_PROVIDER_SITE_OTHER): Payer: Self-pay | Admitting: Vascular Surgery

## 2019-09-25 NOTE — Progress Notes (Signed)
MRN : [id number]  Eric Brooks is a 64 y.o. (January 29, 1956) male who presents with chief complaint of  Chief Complaint  Patient presents with  . Follow-up    ct results  .  History of Present Illness:   The patient returns to the office for followup and review of the CT angiogram. There have been no interval changes in lower extremity symptoms. No interval shortening of the patient's claudication distance or development of rest pain symptoms. No new ulcers or wounds have occurred since the last visit.  There have been no significant changes to the patient's overall health care.  The patient denies amaurosis fugax or recent TIA symptoms. There are no recent neurological changes noted. The patient denies history of DVT, PE or superficial thrombophlebitis. The patient denies recent episodes of angina or shortness of breath.   CT angiogram is reviewed by me and discussed with the patient.  It shows his aorta iliac system to be widely patent with mild atherosclerotic changes.  His SFA stent is occluded from the origin of the SFA to Hunter's canal on the right-hand side.  He appears to have two-vessel runoff bilaterally.  Current Meds  Medication Sig  . aspirin EC 81 MG tablet Take 1 tablet by mouth 2 (two) times daily.   . carvedilol (COREG) 6.25 MG tablet Take 1 tablet (6.25 mg total) by mouth 2 (two) times daily.  . clonazePAM (KLONOPIN) 1 MG tablet Take 1 mg by mouth 2 (two) times daily as needed for anxiety.   Marland Kitchen lisinopril (PRINIVIL,ZESTRIL) 10 MG tablet Take 1 tablet (10 mg total) by mouth daily.  . metFORMIN (GLUCOPHAGE) 500 MG tablet Take 500 mg by mouth 2 (two) times daily with a meal.  . PROAIR HFA 108 (90 Base) MCG/ACT inhaler Inhale 2 puffs into the lungs every 4 (four) hours as needed for wheezing or shortness of breath.   . rosuvastatin (CRESTOR) 5 MG tablet Take 1 tablet (5 mg total) by mouth daily.  Marland Kitchen tiZANidine (ZANAFLEX) 2 MG tablet Take 2 mg by mouth 3 (three) times  daily.     Past Medical History:  Diagnosis Date  . Anxiety   . Arthritis    "they say it but I don't feel it" (11/11/2017)  . Chronic lower back pain   . Chronic pain of lower extremity, bilateral   . COPD (chronic obstructive pulmonary disease) ([place])   . Coronary artery disease   . Diabetic peripheral neuropathy ([place])   . Emphysema lung ([place])   . GERD (gastroesophageal reflux disease)   . Headache    "monthly" (11/11/2017)  . Hypercholesteremia   . Hypertension   . Macular degeneration, bilateral   . Myocardial infarction ([place]) 09/15/2017  . OSA on CPAP   . Prostate cancer ([place]) 2020  . Stroke ([place]) 09/27/2009 X 3   "left sided weakness;memory deficits since then" (11/11/2017)  . Type II diabetes mellitus ([place])     Past Surgical History:  Procedure Laterality Date  . ABDOMINAL AORTOGRAM N/A 11/11/2017   Procedure: ABDOMINAL AORTOGRAM;  Surgeon: Lorretta Harp, MD;  Location: [facility];  Service: Cardiovascular;  Laterality: N/A;  . CORONARY ANGIOPLASTY WITH STENT PLACEMENT  09/15/2017   "2 stents"  . FRACTURE SURGERY    . LOWER EXTREMITY ANGIOGRAPHY Bilateral 11/11/2017   Procedure: LOWER EXTREMITY ANGIOGRAPHY;  Surgeon: Lorretta Harp, MD;  Location: [facility];  Service: Cardiovascular;  Laterality: Bilateral;  . ORIF RADIAL FRACTURE  03/28/2012  Procedure: OPEN REDUCTION INTERNAL FIXATION (ORIF) RADIAL FRACTURE;  Surgeon: Sanjuana Kava, MD;  Location: [facility];  Service: Orthopedics;  Laterality: Right;  ORIF Right Radial Shaft Fracture  . PERIPHERAL VASCULAR INTERVENTION  11/11/2017  . PERIPHERAL VASCULAR INTERVENTION Right 11/11/2017   Procedure: PERIPHERAL VASCULAR INTERVENTION;  Surgeon: Lorretta Harp, MD;  Location: [facility];  Service: Cardiovascular;  Laterality: Right;  SFA  . WISDOM TOOTH EXTRACTION      Social History Social History   Tobacco Use  . Smoking status: Current Every Day Smoker    Packs/day: 0.50    Years: 52.00     Pack years: 26.00    Types: Cigarettes  . Smokeless tobacco: Never Used  Substance Use Topics  . Alcohol use: Not Currently  . Drug use: Not Currently    Types: LSD    Comment: 11/11/2017 "when I was a kid; 1970s"    Family History Family History  Problem Relation Age of Onset  . Cancer Mother   . Bladder Cancer Father   . Esophageal cancer Maternal Grandmother   . Prostate cancer Neg Hx   . Breast cancer Neg Hx   . Colon cancer Neg Hx     Allergies  Allergen Reactions  . Amitriptyline Anaphylaxis    Pt reports elavil allergy  . Shellfish Allergy Anaphylaxis  . Tylenol [Acetaminophen] Shortness Of Breath and Rash     REVIEW OF SYSTEMS (Negative unless checked)  Constitutional: [] Weight loss  [] Fever  [] Chills Cardiac: [] Chest pain   [] Chest pressure   [] Palpitations   [] Shortness of breath when laying flat   [] Shortness of breath with exertion. Vascular:  [x] Pain in legs with walking   [] Pain in legs at rest  [] History of DVT   [] Phlebitis   [] Swelling in legs   [] Varicose veins   [] Non-healing ulcers Pulmonary:   [] Uses home oxygen   [] Productive cough   [] Hemoptysis   [] Wheeze  [] COPD   [] Asthma Neurologic:  [] Dizziness   [] Seizures   [] History of stroke   [] History of TIA  [] Aphasia   [] Vissual changes   [] Weakness or numbness in arm   [] Weakness or numbness in leg Musculoskeletal:   [] Joint swelling   [] Joint pain   [] Low back pain Hematologic:  [] Easy bruising  [] Easy bleeding   [] Hypercoagulable state   [] Anemic Gastrointestinal:  [] Diarrhea   [] Vomiting  [] Gastroesophageal reflux/heartburn   [] Difficulty swallowing. Genitourinary:  [] Chronic kidney disease   [] Difficult urination  [] Frequent urination   [] Blood in urine Skin:  [] Rashes   [] Ulcers  Psychological:  [] History of anxiety   []  History of major depression.  Physical Examination  Vitals:   09/21/19 1540  BP: (!) 160/72  Pulse: 94  Resp: 16  Weight: 216 lb (98 kg)   Body mass index is 31.9  kg/m. Gen: WD/WN, NAD Head: [place]/AT, No temporalis wasting.  Ear/Nose/Throat: Hearing grossly intact, nares w/o erythema or drainage Eyes: PER, EOMI, sclera nonicteric.  Neck: Supple, no large masses.   Pulmonary:  Good air movement, no audible wheezing bilaterally, no use of accessory muscles.  Cardiac: RRR, no JVD Vascular:  Vessel Right Left  Radial Palpable Palpable  PT Not Palpable Not Palpable  DP Not Palpable Not Palpable  Gastrointestinal: Non-distended. No guarding/no peritoneal signs.  Musculoskeletal: M/S 5/5 throughout.  No deformity or atrophy.  Neurologic: CN 2-12 intact. Symmetrical.  Speech is fluent. Motor exam as listed above. Psychiatric: Judgment intact, Mood & affect appropriate for pt's clinical situation. Dermatologic: No  rashes or ulcers noted.  No changes consistent with cellulitis.   CBC Lab Results  Component Value Date   WBC 15.9 (H) 11/12/2017   HGB 13.7 11/12/2017   HCT 40.9 11/12/2017   MCV 88.1 11/12/2017   PLT 218 11/12/2017    BMET    Component Value Date/Time   NA 137 11/12/2017 0213   NA 140 11/02/2017 1425   K 4.1 11/12/2017 0213   CL 107 11/12/2017 0213   CO2 20 (L) 11/12/2017 0213   GLUCOSE 197 (H) 11/12/2017 0213   BUN 12 11/12/2017 0213   BUN 12 11/02/2017 1425   CREATININE 0.70 09/08/2019 1336   CALCIUM 8.3 (L) 11/12/2017 0213   GFRNONAA >60 11/12/2017 0213   GFRAA >60 11/12/2017 0213   Estimated Creatinine Clearance: 110.5 mL/min (by C-G formula based on SCr of 0.7 mg/dL).  COAG Lab Results  Component Value Date   INR 1.0 11/02/2017    Radiology CT ANGIO AO+BIFEM W & OR WO CONTRAST  Result Date: 09/08/2019 CLINICAL DATA:  64 year old male with claudication EXAM: CT ANGIOGRAPHY OF ABDOMINAL AORTA WITH ILIOFEMORAL RUNOFF TECHNIQUE: Multidetector CT imaging of the abdomen, pelvis and lower extremities was performed using the standard protocol during bolus administration of intravenous contrast. Multiplanar CT image  reconstructions and MIPs were obtained to evaluate the vascular anatomy. CONTRAST:  175mL OMNIPAQUE IOHEXOL 350 MG/ML SOLN COMPARISON:  03/20/2012 FINDINGS: VASCULAR Aorta: Unremarkable course, caliber, contour of the abdominal aorta. No dissection, aneurysm, or periaortic fluid. Mild atherosclerosis of the abdominal aorta. Celiac: Patent, with no significant atherosclerotic changes. SMA: Patent, with no significant atherosclerotic changes. Renals: There are 3 renal arteries identified with the 2 most superior slightly smaller than the most inferior. No significant atherosclerotic changes at the origin of the right renal arteries. The main left renal artery without significant atherosclerotic changes. Accessory left renal artery originates from the lower abdominal aorta without significant atherosclerosis. IMA: Inferior mesenteric artery is patent. Right lower extremity: Mild atherosclerosis of the common iliac artery without high-grade stenosis or occlusion. Hypogastric artery is patent with mild atherosclerosis. External iliac artery slightly tortuous without significant atherosclerosis. Common femoral artery with mild atherosclerosis and no significant stenosis. Profunda femoris is patent with patent thigh branches. Stent system of the native SFA is occluded just after the origin of the SFA. The occlusion extends through the adductor canal where there is reconstitution of the SFA and popliteal artery. Popliteal artery patent without significant stenosis. Anterior tibial artery is patent at the origin and appears patent to the ankle with minimal atherosclerosis. Tibioperoneal trunk is patent with moderate mixed calcified and soft plaque. Peroneal artery is patent from the origin to the ankle. Posterior tibial artery is patent from the origin to the ankle. Left lower extremity: Mild atherosclerosis of the left common iliac artery. No high-grade stenosis or occlusion. Hypogastric artery is patent. Mild tortuosity of  the external iliac artery with mild atherosclerosis. No high-grade stenosis or occlusion. Mild atherosclerosis of the common femoral artery. Profunda femoris and the branches are patent. Moderate atherosclerotic changes of the right SFA including focal stenosis in the adductor canal secondary to calcified plaque. Popliteal artery patent throughout its length. Greatest diameter of the popliteal artery measures 8 mm. Variant division of the popliteal artery, with a high posterior tibial artery origin. The tibioperoneal trunk contributes to anterior tibial artery and the peroneal artery. Posterior tibial artery is occluded in the mid segment with reconstitution distally. Anterior tibial artery is patent from the origin to the ankle.  Peroneal artery is patent from the origin to the ankle. Veins: Unremarkable appearance of the venous system. Review of the MIP images confirms the above findings. NON-VASCULAR Lower chest: Respiratory motion somewhat limits evaluation of the lungs. Geographic ground-glass opacity is present, likely indicating small airway disease. Hepatobiliary: Unremarkable appearance of the liver. Unremarkable gall bladder. Pancreas: Unremarkable. Spleen: Unremarkable. Adrenals/Urinary Tract: Unremarkable appearance of adrenal glands. Right: No hydronephrosis. Symmetric perfusion to the left. No nephrolithiasis. Unremarkable course of the right ureter. Left: No hydronephrosis. Symmetric perfusion to the right. No nephrolithiasis. Unremarkable course of the left ureter. Hypoattenuating cysts on the anterior cortex of the left kidney, with Hounsfield units in the range of fluid, with no internal complexity. These are most likely benign cysts. Unremarkable appearance of the urinary bladder . Stomach/Bowel: Unremarkable appearance of the stomach. Unremarkable appearance of small bowel. No evidence of obstruction. Normal appendix. Colonic diverticular disease without evidence of inflammatory changes.  Lymphatic: No adenopathy. Mesenteric: No free fluid or air. No mesenteric adenopathy. Reproductive: Unremarkable appearance of the pelvic organs. Other: Fat containing umbilical hernia. Musculoskeletal: No evidence of acute fracture. No bony canal narrowing. Bilateral L5 pars defect with grade 1 anterolisthesis. Degenerative changes of the visualized thoracolumbar spine. IMPRESSION: Multilevel PAD, including: - Aortic Atherosclerosis (ICD10-I70.0). -bilateral mild iliac arterial disease without high-grade stenosis or occlusion. -advanced right femoropopliteal arterial disease, with occluded right SFA stent system and reconstitution of the popliteal artery. -moderate left femoropopliteal arterial disease, with likely short segment high-grade stenosis in the adductor canal -mild right tibial arterial disease with what appears to be 3 patent tibial arteries to the ankle -mild to moderate left-sided tibial arterial disease, with single occlusion in the mid segment of the posterior tibial artery. Variant tibial origin anatomy on the left. Evidence of emphysema with small airway disease at the lung bases. Emphysema (ICD10-J43.9). Additional ancillary findings as above. Signed, Dulcy Fanny. Dellia Nims, RPVI [facility] Radiology Electronically Signed   By: Corrie Mckusick D.O.   On: 09/08/2019 16:30      Assessment/Plan 1. Atherosclerosis of native artery of both lower extremities with intermittent claudication (HCC)  Recommend:  The patient has evidence of atherosclerosis of the lower extremities with claudication.  The patient does not voice lifestyle limiting changes at this point in time.  Noninvasive studies do not suggest clinically significant change.  No invasive studies, angiography or surgery at this time The patient should continue walking and begin a more formal exercise program.  The patient should continue antiplatelet therapy and aggressive treatment  of the lipid abnormalities  No changes in the patient's medications at this time  The patient should continue wearing graduated compression socks 10-15 mmHg strength to control the mild edema.   - VAS [place] ABI WITH/WO TBI; Future  2. Essential hypertension Continue antihypertensive medications as already ordered, these medications have been reviewed and there are no changes at this time.   3. Coronary artery disease involving native coronary artery of native heart without angina pectoris Continue cardiac and antihypertensive medications as already ordered and reviewed, no changes at this time.  Continue statin as ordered and reviewed, no changes at this time  Nitrates PRN for chest pain   4. Chronic obstructive pulmonary disease, unspecified COPD type ([place]) Continue pulmonary medications and aerosols as already ordered, these medications have been reviewed and there are no changes at this time.    5. Type 2 diabetes mellitus with complication, without long-term current use of insulin (HCC) Continue hypoglycemic  medications as already ordered, these medications have been reviewed and there are no changes at this time.  Hgb A1C to be monitored as already arranged by primary service    Hortencia Pilar, MD  09/25/2019 5:04 PM

## 2019-10-19 ENCOUNTER — Other Ambulatory Visit: Admission: RE | Admit: 2019-10-19 | Payer: Medicaid Other | Source: Ambulatory Visit

## 2019-10-23 ENCOUNTER — Encounter: Admission: RE | Payer: Self-pay | Source: Home / Self Care

## 2019-10-23 ENCOUNTER — Ambulatory Visit: Admission: RE | Admit: 2019-10-23 | Payer: Medicaid Other | Source: Home / Self Care | Admitting: Internal Medicine

## 2019-10-23 SURGERY — COLONOSCOPY WITH PROPOFOL
Anesthesia: General

## 2019-11-03 ENCOUNTER — Encounter: Payer: Self-pay | Admitting: Medical Oncology

## 2019-11-03 NOTE — Progress Notes (Signed)
Left message requesting a return call to discuss scheduling radiation for his prostate cancer. Late last fall he asked to post pone treatment until COVID had settled down. He had started ADT with Dr. Diona Fanti. I asked him to call me so we can get scheduled.

## 2019-12-13 ENCOUNTER — Telehealth: Payer: Self-pay | Admitting: *Deleted

## 2019-12-13 NOTE — Telephone Encounter (Signed)
CALLED PATIENT TO ASK IF HE HAS DECIDED TO GO WITH RADIATION, PATIENT INFORMED ME THAT HE HAS DECIDED TO NOT HAVE RADIATION

## 2020-01-15 IMAGING — NM NUCLEAR MEDICINE WHOLE BODY BONE SCINTIGRAPHY
4 series · 4 of 4 positions shown · non-contrast
Comparison: None

Correlation: CT pelvis 01/10/2019

CLINICAL DATA: Prostate cancer

EXAM:
NUCLEAR MEDICINE WHOLE BODY BONE SCAN
TECHNIQUE: Whole body anterior and posterior images were obtained approximately
3 hours after intravenous injection of radiopharmaceutical.
RADIOPHARMACEUTICALS:  20.5 mCi Technetium-OOm MDP IV

[Series 1: whole body · 2.66mm/px · 1 of 1 slices shown (1 of 4)]
[im 1/1]
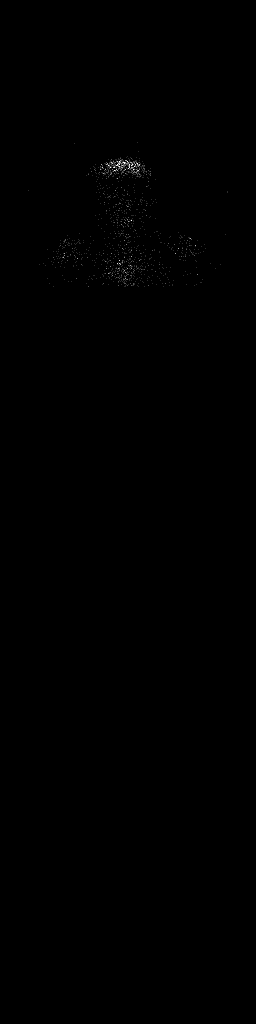

[Series 1: whole body · 2.66mm/px · 1 of 1 slices shown (2 of 4)]
[im 1/1]
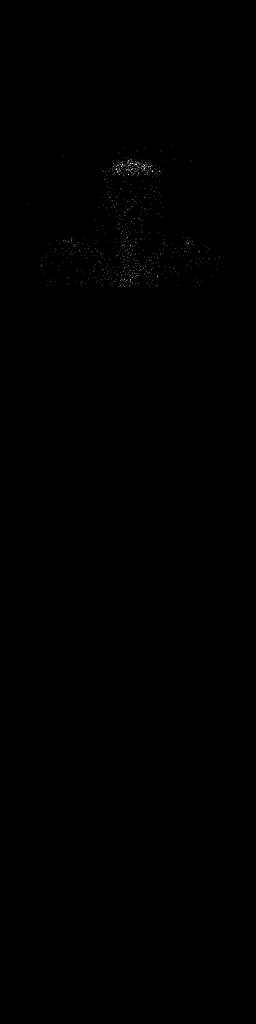

[Series 2: whole body · 2.66mm/px · 1 of 1 slices shown (3 of 4)]
[im 1/1]
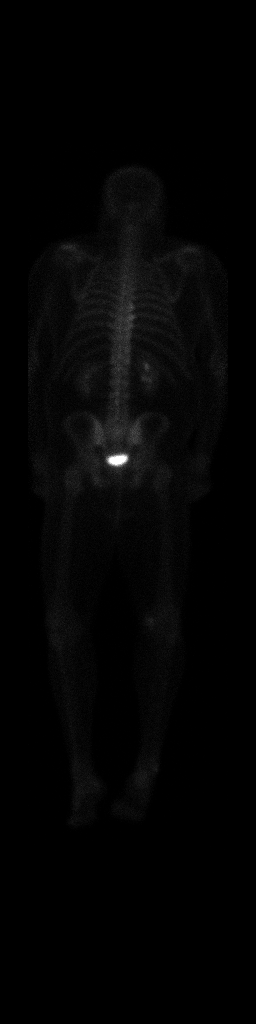

[Series 2: whole body · 2.66mm/px · 1 of 1 slices shown (4 of 4)]
[im 1/1]
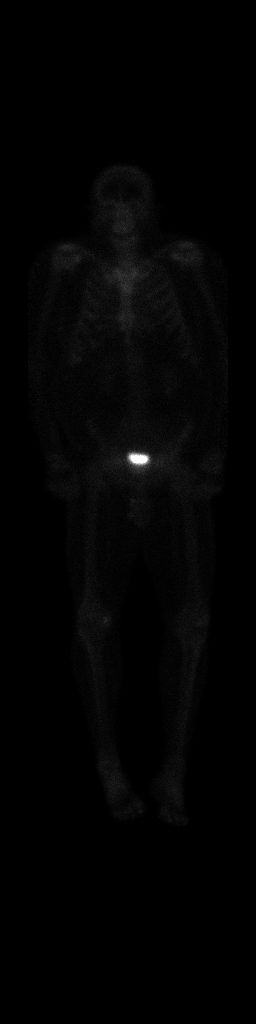

[4 of 4 positions shown; findings below may reference images not displayed]

FINDINGS: Increased uptake at adjacent RIGHT midthoracic costovertebral
junctions likely degenerative.

Additional degenerative type uptake at multiple joints including
shoulders, sternoclavicular joints, hips, knees.

No worrisome sites of abnormal osseous tracer accumulation are
identified to suggest osseous metastatic disease.

Scattered costal cartilaginous calcification.
IMPRESSION: No definite scintigraphic evidence of osseous metastatic disease.

## 2020-03-21 ENCOUNTER — Ambulatory Visit (INDEPENDENT_AMBULATORY_CARE_PROVIDER_SITE_OTHER): Payer: Medicaid Other | Admitting: Vascular Surgery

## 2020-03-21 ENCOUNTER — Encounter (INDEPENDENT_AMBULATORY_CARE_PROVIDER_SITE_OTHER): Payer: Medicaid Other

## 2021-05-16 ENCOUNTER — Other Ambulatory Visit
Admission: RE | Admit: 2021-05-16 | Discharge: 2021-05-16 | Disposition: A | Discharge status: Home / Self Care | Payer: Medicaid Other | Source: Ambulatory Visit | Attending: Neurology | Admitting: Neurology

## 2021-05-16 ENCOUNTER — Other Ambulatory Visit: Payer: Self-pay

## 2021-05-16 DIAGNOSIS — Z20822 Contact with and (suspected) exposure to covid-19: Secondary | ICD-10-CM | POA: Insufficient documentation

## 2021-05-16 DIAGNOSIS — Z01812 Encounter for preprocedural laboratory examination: Secondary | ICD-10-CM | POA: Diagnosis not present

## 2021-05-16 DIAGNOSIS — Z1152 Encounter for screening for COVID-19: Secondary | ICD-10-CM

## 2021-05-17 LAB — SARS CORONAVIRUS 2 (TAT 6-24 HRS): SARS Coronavirus 2: NEGATIVE

## 2022-01-20 DIAGNOSIS — G4700 Insomnia, unspecified: Secondary | ICD-10-CM | POA: Insufficient documentation

## 2022-01-30 DIAGNOSIS — R918 Other nonspecific abnormal finding of lung field: Secondary | ICD-10-CM | POA: Insufficient documentation

## 2022-02-09 ENCOUNTER — Ambulatory Visit: Payer: Medicaid Other | Admitting: Podiatry

## 2022-02-09 ENCOUNTER — Encounter: Payer: Self-pay | Admitting: Podiatry

## 2022-02-09 DIAGNOSIS — L600 Ingrowing nail: Secondary | ICD-10-CM

## 2022-02-09 MED ORDER — NEOMYCIN-POLYMYXIN-HC 1 % OT SOLN: OTIC | 1 refills | Status: DC

## 2022-02-09 NOTE — Progress Notes (Signed)
He presents today chief concern of painful hallux tibial-fibular border the hallux right.  States that the margins are sharply ingrown and he would like to consider having them removed.  Objective: Vital signs are stable alert oriented x3.  Pulses are palpable.  There is no erythema edema cellulitis drainage or odor but painful on palpation.  Assessment: Pain in limb secondary to ingrown nail hallux right.  Plan: Chemical matricectomy was performed to the tibial fibular border of the hallux right today after local anesthetic was administered he tolerated procedure well without complications.  He was given both oral and written home-going instruction for care and soaking of the toe as well as a prescription for Cortisporin Otic to be applied twice daily after soaking.  I will follow-up with him in 2 weeks.

## 2022-02-09 NOTE — Patient Instructions (Signed)
temporal arteritisBetadine Soak Instructions  Purchase an 8 oz. bottle of BETADINE solution (Povidone)  THE DAY AFTER THE PROCEDURE  Place 1 tablespoon of betadine solution in a quart of warm tap water.  Submerge your foot or feet with outer bandage intact for the initial soak; this will allow the bandage to become moist and wet for easy lift off.  Once you remove your bandage, continue to soak in the solution for 20 minutes.  This soak should be done twice a day.  Next, remove your foot or feet from solution, blot dry the affected area and cover.  You may use a band aid large enough to cover the area or use gauze and tape.  Apply other medications to the area as directed by the doctor such as cortisporin otic solution (ear drops) or neosporin.  IF YOUR SKIN BECOMES IRRITATED WHILE USING THESE INSTRUCTIONS, IT IS OKAY TO SWITCH TO EPSOM SALTS AND WATER OR WHITE VINEGAR AND WATER.   [facility] Instructions-Post Nail Surgery  You have had your ingrown toenail and root treated with a chemical.  This chemical causes a burn that will drain and ooze like a blister.  This can drain for 6-8 weeks or longer.  It is important to keep this area clean, covered, and follow the soaking instructions dispensed at the time of your surgery.  This area will eventually dry and form a scab.  Once the scab forms you no longer need to soak or apply a dressing.  If at any time you experience an increase in pain, redness, swelling, or drainage, you should contact the office as soon as possible.

## 2022-02-23 ENCOUNTER — Encounter: Payer: Self-pay | Admitting: Podiatry

## 2022-02-23 ENCOUNTER — Ambulatory Visit (INDEPENDENT_AMBULATORY_CARE_PROVIDER_SITE_OTHER): Payer: Medicaid Other | Admitting: Podiatry

## 2022-02-23 DIAGNOSIS — Z9889 Other specified postprocedural states: Secondary | ICD-10-CM

## 2022-02-23 DIAGNOSIS — L600 Ingrowing nail: Secondary | ICD-10-CM

## 2022-02-23 NOTE — Progress Notes (Signed)
He presents today for follow-up of his matricectomy right hallux.  He denies fever chills nausea vomiting muscle aches and pains states that he only soaked at 1 time.  Objective: There is no erythema edema cellulitis drainage odor appears to be healing very nicely there is some scab along the medial lateral borders there is no purulence no tenderness on palpation.  Assessment: Well-healing surgical toe.  Plan: Follow-up with me on an as-needed basis

## 2022-03-16 ENCOUNTER — Encounter (INDEPENDENT_AMBULATORY_CARE_PROVIDER_SITE_OTHER): Payer: Self-pay

## 2022-09-03 ENCOUNTER — Ambulatory Visit: Payer: 59 | Admitting: Occupational Therapy

## 2022-09-03 ENCOUNTER — Encounter: Payer: Self-pay | Admitting: Speech Pathology

## 2022-09-03 ENCOUNTER — Ambulatory Visit: Payer: 59 | Attending: Neurosurgery | Admitting: Speech Pathology

## 2022-09-03 ENCOUNTER — Encounter: Payer: Self-pay | Admitting: Occupational Therapy

## 2022-09-03 DIAGNOSIS — R251 Tremor, unspecified: Secondary | ICD-10-CM | POA: Insufficient documentation

## 2022-09-03 DIAGNOSIS — R471 Dysarthria and anarthria: Secondary | ICD-10-CM

## 2022-09-03 DIAGNOSIS — M6281 Muscle weakness (generalized): Secondary | ICD-10-CM | POA: Insufficient documentation

## 2022-09-03 DIAGNOSIS — R1312 Dysphagia, oropharyngeal phase: Secondary | ICD-10-CM

## 2022-09-03 DIAGNOSIS — R278 Other lack of coordination: Secondary | ICD-10-CM

## 2022-09-03 DIAGNOSIS — R41841 Cognitive communication deficit: Secondary | ICD-10-CM | POA: Diagnosis not present

## 2022-09-03 NOTE — Therapy (Signed)
OUTPATIENT OCCUPATIONAL THERAPY NEURO EVALUATION  Patient Name: Eric Brooks MRN: [id number] DOB:[birth year], 67 y.o., male Today's Date: 09/03/2022  PCP: Lorin Picket [facility] REFERRING PROVIDER: Wallis Bamberg Orma Flaming, NP    END OF SESSION:  OT End of Session - 09/03/22 1150     Visit Number 1    Number of Visits 25    Date for OT Re-Evaluation 11/26/22    OT Start Time 1145    OT Stop Time 1230    OT Time Calculation (min) 45 min    Activity Tolerance Patient tolerated treatment well    Behavior During Therapy [facility] for tasks assessed/performed             Past Medical History:  Diagnosis Date   Anxiety    Arthritis    "they say it but I don't feel it" (11/11/2017)   Chronic lower back pain    Chronic pain of lower extremity, bilateral    COPD (chronic obstructive pulmonary disease)    Coronary artery disease    Diabetic peripheral neuropathy    Emphysema lung    GERD (gastroesophageal reflux disease)    Headache    "monthly" (11/11/2017)   Hypercholesteremia    Hypertension    Macular degeneration, bilateral    Myocardial infarction 09/15/2017   OSA on CPAP    Prostate cancer 2020   Stroke 09/27/2009 X 3   "left sided weakness;memory deficits since then" (11/11/2017)   Type II diabetes mellitus    Past Surgical History:  Procedure Laterality Date   ABDOMINAL AORTOGRAM N/A 11/11/2017   Procedure: ABDOMINAL AORTOGRAM;  Surgeon: Runell Gess, MD;  Location: [facility];  Service: Cardiovascular;  Laterality: N/A;   CORONARY ANGIOPLASTY WITH STENT PLACEMENT  09/15/2017   "2 stents"   FRACTURE SURGERY     LOWER EXTREMITY ANGIOGRAPHY Bilateral 11/11/2017   Procedure: LOWER EXTREMITY ANGIOGRAPHY;  Surgeon: Runell Gess, MD;  Location: [facility];  Service: Cardiovascular;  Laterality: Bilateral;   ORIF RADIAL FRACTURE  03/28/2012   Procedure: OPEN REDUCTION INTERNAL FIXATION (ORIF) RADIAL FRACTURE;  Surgeon: Darreld Mclean,  MD;  Location: [facility];  Service: Orthopedics;  Laterality: Right;  ORIF Right Radial Shaft Fracture   PERIPHERAL VASCULAR INTERVENTION  11/11/2017   PERIPHERAL VASCULAR INTERVENTION Right 11/11/2017   Procedure: PERIPHERAL VASCULAR INTERVENTION;  Surgeon: Runell Gess, MD;  Location: [facility];  Service: Cardiovascular;  Laterality: Right;  SFA   WISDOM TOOTH EXTRACTION     Patient Active Problem List   Diagnosis Date Noted   Pulmonary nodules 01/30/2022   Insomnia 01/20/2022   Malignant neoplasm of prostate 02/13/2019   Atherosclerosis of native arteries of extremity with intermittent claudication 11/11/2017   Tobacco abuse 10/20/2017   COPD (chronic obstructive pulmonary disease) 10/20/2017   Hyperlipidemia 10/20/2017   Essential hypertension 10/20/2017   Type 2 diabetes mellitus with complication, without long-term current use of insulin 10/20/2017   Peripheral arterial disease 10/20/2017   Coronary artery disease 10/20/2017   23-polyvalent pneumococcal polysaccharide vaccine declined 08/14/2016   Encounter for colorectal cancer screening 05/23/2015   Chronic pain 11/24/2013   Obstructive sleep apnea (adult) (pediatric) 08/31/2013   Plantar fasciitis 08/31/2013   Retinal degeneration 08/31/2013   RLS (restless legs syndrome) 08/31/2013   Obesity 01/12/2013   Sprain of right ankle 10/11/2012   Wrist fracture 09/20/2012   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 08/23/2012   Contact with and suspected exposure to  hazardous aromatic compounds 04/06/2012   Acquired spondylolisthesis 12/19/2010   DDD (degenerative disc disease), lumbosacral 12/19/2010   Lumbosacral spondylosis 12/19/2010   Cerebral artery occlusion with cerebral infarction 09/30/2009   History of transient ischemic attack (TIA) 09/30/2009    ONSET DATE: 07/26/22  REFERRING DIAG:  TBI, Subdural Hematoma  THERAPY DIAG:  Other lack of coordination  Muscle weakness  (generalized)  Tremor  Rationale for Evaluation and Treatment: Rehabilitation  SUBJECTIVE:   SUBJECTIVE STATEMENT:  Pt reports headaches and difficulty keeping eyes open 2/2 lights.   Pt accompanied by: significant other  PERTINENT HISTORY: Eric Brooks is a 67 y.o. male with PMHx prostate CA (diagnosed 2021/22) and lung CA (diagnosed November 2023), OSA, T2DM, retinal degeneration, CPS, chronic bronchitis, HTN, PAD, who presented to the ED 07/26/22 after a level 1 trauma after falling on the back of his head and notably had blood coming out of the right ear. Initially he was GCS12 and confused - pt was admitted but improved substantially from a neurological perspective over the next few days. Found to have a right subdural hematoma and associated nondepressed skull fracture. He had a stroke in November, 2023 (Acute infarct centered in the left globus pallidus). He discharged to [facility] and since d/c home he has been walking with a rolling walker.  PRECAUTIONS: None  WEIGHT BEARING RESTRICTIONS: No  PAIN:  Are you having pain? Yes: NPRS scale: 8/10 Pain location: headache, low back   FALLS: Has patient fallen in last 6 months? Yes. Number of falls 5  LIVING ENVIRONMENT: Lives with: lives with their spouse Lives in: House/apartment Stairs: Yes: External: 1 steps; none Has following equipment at home: Single point cane, Walker - 2 wheeled, shower chair, and bed side commode  PLOF: Independent  PATIENT GOALS: To mow his yard.   OBJECTIVE:   HAND DOMINANCE: Right  ADLs: Overall ADLs: SUPERVISION Transfers/ambulation related to ADLs: Eating: IND Grooming: IND UB Dressing: IND LB Dressing: MIN A Toileting: MIN A - thoroughness wiping for BM only Bathing: SUPERVISION seated Tub Shower transfers: SUPERVISION Equipment: Shower seat without back  IADLs: Shopping: spouse completes Light housekeeping: spouse completes Meal Prep: spouse  completes Community mobility: RW for limited distances  Medication management: spouse completes Landscape architect: spouse completes  Handwriting: 90% legible  MOBILITY STATUS: Independent  POSTURE COMMENTS:  rounded shoulders  ACTIVITY TOLERANCE: Activity tolerance: Tolerates ~10 min standing activities prior to requiring rest break   FUNCTIONAL OUTCOME MEASURES: FOTO: 45  UPPER EXTREMITY ROM:  WFL  UPPER EXTREMITY MMT:     MMT Right eval Left eval  Shoulder flexion 5 5  Shoulder abduction 5 5  Shoulder adduction    Shoulder extension    Shoulder internal rotation    Shoulder external rotation    Middle trapezius    Lower trapezius    Elbow flexion 4/5 5/5  Elbow extension 4/5 5/5  Wrist flexion    Wrist extension    Wrist ulnar deviation    Wrist radial deviation    Wrist pronation    Wrist supination    (Blank rows = not tested)  HAND FUNCTION: Grip strength: Right: 38 lbs; Left: 40 lbs, Lateral pinch: Right: 15 lbs, Left: 12 lbs, and 3 point pinch: Right: 13 lbs, Left: 13 lbs  COORDINATION: 9 Hole Peg test: Right: 52 sec; Left: 48 sec  SENSATION: Light touch: Impaired   EDEMA: N/A   COGNITION: Overall cognitive status: Impaired and Difficulty to assess due to: difficulty  keeping eyes open and engaging in lengthy conversation 2/2 headaches / light sensitivity. Answers all questions appropriately with intermittent cues to attend   VISION: Subjective report: Difficulty with R eyelid following  Baseline vision: No visual deficits Visual history: brain injury  VISION ASSESSMENT: To be further assessed in functional context   PERCEPTION: WFL  PRAXIS: Not tested  OBSERVATIONS: R eye movements appear delayed    TODAY'S TREATMENT:                                                                                                                              DATE: 09/03/22   PATIENT EDUCATION: Education details: OT POC, goals Person educated:  Patient and Spouse Education method: Explanation, Demonstration, and Verbal cues Education comprehension: verbalized understanding, returned demonstration, verbal cues required, and tactile cues required  HOME EXERCISE PROGRAM: Dark blue theraputty provided   GOALS: Goals reviewed with patient? Yes  SHORT TERM GOALS: Target date: 10/15/2022  Pt will Independently complete HEP for improved BUE strengthening.  Baseline: Blue theraputty provided with mod cueing to complete exercises.  Goal status: INITIAL   LONG TERM GOALS: Target date: 11/26/2022  Pt will improve B dexterity as demonstrated by 15 seconds improvement on 9 hole PEG test.  Baseline: R 52", L 48" Goal status: INITIAL  2.  Pt will improve B grip strength by 10# to independently open pickle jars.  Baseline: R 38#, L 40# Goal status: INITIAL  3.  Pt will Independently verbalize plan to implement x3 adapted strategies for tremor management. Baseline: unable to verbalize  Goal status: INITIAL  4.  Pt will Independently complete LB dressing including socks/shoes  Baseline: MIN A (spouse assists with socks/shoes and threading pants) Goal status: INITIAL  5.  Pt will tolerate 30 min standing home making task with MOD I.  Baseline: spouse completes, tolerates ~10 min standing tasks.  Goal status: INITIAL  6.  Pt will increase FOTO score by 5 points to reflect pt perceived improvement with assessment specific ADL/IADL's.  Baseline: 45 Goal status: INITIAL  ASSESSMENT:  CLINICAL IMPRESSION: Patient is a 67 y.o. 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 Pt recently discharged home from [facility]. Pts BUE AROM and strength is WFL, (exception of R elbow flexion/extension grossly 4/5). Demonstrates deficits in grip strength, pinch strength, and FMC skills in B hands. Pt is grossly SUPERVISION for shower t/fs, MIN A for LB dresing. Pt presents with 8/10 headaches and  light sensitivity. Pt. 's FOTO score is 45. Pt will benefit from OT services to improve B grip strength, FMC, and compensatory strategies during I/ADLs in order to improve independence with dressing, dish washing, lawn mowing, and light meal preparation.   PERFORMANCE DEFICITS: in functional skills including ADLs, IADLs, coordination, and strength, cognitive skills including attention, memory, problem solving, and sequencing, and psychosocial skills including environmental adaptation.   IMPAIRMENTS: are limiting patient from ADLs and IADLs.  CO-MORBIDITIES: may have co-morbidities  that affects occupational performance. Patient will benefit from skilled OT to address above impairments and improve overall function.  MODIFICATION OR ASSISTANCE TO COMPLETE EVALUATION: Min-Moderate modification of tasks or assist with assess necessary to complete an evaluation.  OT OCCUPATIONAL PROFILE AND HISTORY: Detailed assessment: Review of records and additional review of physical, cognitive, psychosocial history related to current functional performance.  CLINICAL DECISION MAKING: Moderate - several treatment options, min-mod task modification necessary  REHAB POTENTIAL: Good  EVALUATION COMPLEXITY: Moderate    PLAN:  OT FREQUENCY: 2x/week  OT DURATION: 12 weeks  PLANNED INTERVENTIONS: self care/ADL training, therapeutic exercise, therapeutic activity, neuromuscular re-education, balance training, patient/family education, cognitive remediation/compensation, visual/perceptual remediation/compensation, energy conservation, and DME and/or AE instructions  RECOMMENDED OTHER SERVICES: SLP  CONSULTED AND AGREED WITH PLAN OF CARE: Patient and family member/caregiver  PLAN FOR NEXT SESSION: grip strengthening, FMC, and activity tolerance   Presley Raddle, OT 09/03/2022, 1:59 PM

## 2022-09-03 NOTE — Therapy (Signed)
OUTPATIENT SPEECH LANGUAGE PATHOLOGY  COGNITION EVALUATION   Patient Name: SABAN HEINLEN MRN: [id number] DOB:November 25, 1955, 67 y.o., male Today's Date: 09/03/2022   REFERRING PROVIDER: Audree Bane, NP    End of Session - 09/03/22 1315     Visit Number 1    Number of Visits 24    Date for SLP Re-Evaluation 12/02/22    SLP Start Time 1103    SLP Stop Time  1145    SLP Time Calculation (min) 42 min    Activity Tolerance Patient tolerated treatment well             Past Medical History:  Diagnosis Date   Anxiety    Arthritis    "they say it but I don't feel it" (11/11/2017)   Chronic lower back pain    Chronic pain of lower extremity, bilateral    COPD (chronic obstructive pulmonary disease)    Coronary artery disease    Diabetic peripheral neuropathy    Emphysema lung    GERD (gastroesophageal reflux disease)    Headache    "monthly" (11/11/2017)   Hypercholesteremia    Hypertension    Macular degeneration, bilateral    Myocardial infarction 09/15/2017   OSA on CPAP    Prostate cancer 2020   Stroke 09/27/2009 X 3   "left sided weakness;memory deficits since then" (11/11/2017)   Type II diabetes mellitus    Past Surgical History:  Procedure Laterality Date   ABDOMINAL AORTOGRAM N/A 11/11/2017   Procedure: ABDOMINAL AORTOGRAM;  Surgeon: Runell Gess, MD;  Location: [facility];  Service: Cardiovascular;  Laterality: N/A;   CORONARY ANGIOPLASTY WITH STENT PLACEMENT  09/15/2017   "2 stents"   FRACTURE SURGERY     LOWER EXTREMITY ANGIOGRAPHY Bilateral 11/11/2017   Procedure: LOWER EXTREMITY ANGIOGRAPHY;  Surgeon: Runell Gess, MD;  Location: [facility];  Service: Cardiovascular;  Laterality: Bilateral;   ORIF RADIAL FRACTURE  03/28/2012   Procedure: OPEN REDUCTION INTERNAL FIXATION (ORIF) RADIAL FRACTURE;  Surgeon: Darreld Mclean, MD;  Location: [facility];  Service: Orthopedics;  Laterality: Right;  ORIF Right Radial Shaft Fracture    PERIPHERAL VASCULAR INTERVENTION  11/11/2017   PERIPHERAL VASCULAR INTERVENTION Right 11/11/2017   Procedure: PERIPHERAL VASCULAR INTERVENTION;  Surgeon: Runell Gess, MD;  Location: [facility];  Service: Cardiovascular;  Laterality: Right;  SFA   WISDOM TOOTH EXTRACTION     Patient Active Problem List   Diagnosis Date Noted   Pulmonary nodules 01/30/2022   Insomnia 01/20/2022   Malignant neoplasm of prostate 02/13/2019   Atherosclerosis of native arteries of extremity with intermittent claudication 11/11/2017   Tobacco abuse 10/20/2017   COPD (chronic obstructive pulmonary disease) 10/20/2017   Hyperlipidemia 10/20/2017   Essential hypertension 10/20/2017   Type 2 diabetes mellitus with complication, without long-term current use of insulin 10/20/2017   Peripheral arterial disease 10/20/2017   Coronary artery disease 10/20/2017   23-polyvalent pneumococcal polysaccharide vaccine declined 08/14/2016   Encounter for colorectal cancer screening 05/23/2015   Chronic pain 11/24/2013   Obstructive sleep apnea (adult) (pediatric) 08/31/2013   Plantar fasciitis 08/31/2013   Retinal degeneration 08/31/2013   RLS (restless legs syndrome) 08/31/2013   Obesity 01/12/2013   Sprain of right ankle 10/11/2012   Wrist fracture 09/20/2012   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 08/23/2012   Contact with and suspected exposure to hazardous aromatic compounds 04/06/2012   Acquired spondylolisthesis 12/19/2010   DDD (degenerative disc disease),  lumbosacral 12/19/2010   Lumbosacral spondylosis 12/19/2010   Cerebral artery occlusion with cerebral infarction 09/30/2009   History of transient ischemic attack (TIA) 09/30/2009    ONSET DATE: 08/28/2022 referral date; TBI 07/26/22 after fall, CVA in Nov. 2023  REFERRING DIAG: Traumatic injury of head  THERAPY DIAG:  Cognitive communication deficit  Dysarthria and anarthria  Dysphagia, oropharyngeal phase  Rationale  for Evaluation and Treatment Rehabilitation  SUBJECTIVE:   SUBJECTIVE STATEMENT: Pt kept eyes closed and did not respond to initial greeting. Pt accompanied by: significant other  PERTINENT HISTORY: Randloph Markell is a 67 y.o., male with PMH including multiple CVAs (2011, November 2023), prostate cancer, lung cancer (dx November 2023), OSA, T2DM, retinal degeneration, CPS, chronic bronchitis, HTN, and PAD. Patient presented to ED 07/26/22 as a level 1 trauma after falling on the back of his head on the back of his head and had blood coming out of the right ear. Initially he was GCS12 and confused - pt was admitted but improved substantially from a neurological perspective over the next few days. Found to have a right subdural hematoma and associated nondepressed skull fracture. He discharged to [facility] and has since d/c home. He did not receive any ST services following his CVA in 2023.    DIAGNOSTIC FINDINGS: CT head on 07/26/22 with findings :1. Acute subdural hematoma overlying the right cerebral convexity measuring up to 7 mm in width with extension along the right tentorium. 2. Age indeterminate thin subdural collection overlying the left cerebral  convexity with internal density suggestive of subacute or chronic subdural. This was not present on the 04/17/22 MRI. 3. Subarachnoid hemorrhage predominantly within the sulci of the bilateral frontal lobes. Additional subarachnoid hemorrhage and/or intraparenchymal hemorrhage in the right temporooccipital region. 4. Acute minimally displaced fracture of the right parietal bone which extends into the right mastoid/temporal bone with longitudinal and transverse components with extension into the middle ear cavity and canal for the internal carotid artery. Correlation with CTA is recommended.  Please see also dedicated facial bone CT. Recommend dedicated CT temporal  bone. 5. Fracture at the lateral wall of the right sphenoid with  associated blood  products in the right sphenoid sinus.    PAIN:  Are you having pain? Yes: Pain location: eyes  FACES: 4   FALLS: Has patient fallen in last 6 months?  Yes, Number of falls: 5 (slipped to floor 3 times)  LIVING ENVIRONMENT: Lives with: lives with their family Lives in: House/apartment  PLOF:  Level of assistance: Comment: has needed assistance with ADLs since CVA in 2023, increased level of assistance necessary since TBI    PATIENT GOALS   improve voice, attentiveness, safety  OBJECTIVE:   COGNITIVE COMMUNICATION Overall cognitive status: Impaired Areas of impairment:  Oriented to person Oriented to place Attention: Impaired: Sustained, Comment: Requires verbal and sometimes tactile prompting every 30 seconds to 2 minutes to open eyes and engage in tasks Memory: Impaired: Short term Behavior: flat affect Safety Awareness: Impaired Spouse reports pt "got on the mower" yesterday while she was at work (he was home with a caregiver)   AUDITORY COMPREHENSION  Overall auditory comprehension:  limited by attention vs language YES/NO questions: Not tested Following directions:  impaired ability to follow motor commands (open eyes, oral motor examination) Conversation:  impaired due to attention Interfering components: attention and arousal Effective technique:  repetition, simplified commands, demonstration  READING COMPREHENSION: not formally assessed; pt able to read Grandfather passage but with several  errors noted, and difficulty maintaining attention   EXPRESSION: verbal  VERBAL EXPRESSION:   Overall verbal expression:  limited assessment due to severity of dysathria and attention deficits Level of generative/spontaneous verbalization: phrase Automatic speech: name: intact  Repetition: not tested Naming: Divergent: 0-25% 5 animals in 50 seconds Pragmatics: Impaired: dysprosody, eye contact, interpretation of nonverbal communication, monotone, and  abnormal affect Interfering components: attention and speech intelligibility   WRITTEN EXPRESSION: Dominant hand: right Written expression: Not tested  ORAL MOTOR EXAMINATION Facial : Symmetry impaired: Impaired right, Suspect CN VII impairment Lingual: Symmetry Impaired: Impaired left, Suspect CN XII impairment Velum: WFL Mandible: Strength impaired: Impaired left, Impaired right Cough: WFL Voice: Other: gravelly, low intensity  MOTOR SPEECH: Overall motor speech: impaired Level of impairment: Word Respiration: thoracic breathing Phonation: hoarse and low vocal intensity Resonance: WFL Articulation: Impaired: word Intelligibility: Intelligibility reduced Motor planning:  difficult to assess due to severity of cognitive impairments Motor speech errors: aware and unaware Maximum phonation time for /a/: 5.3 sec with rough, gravelly quality and low intensity    STANDARDIZED ASSESSMENTS: Mini Addenbrooke's Cognitive Examination - M-ACE The Mini Addenbrooke's Cognitive Examination is a brief cognitive test that assesses five cognitive domains. The M-ACE consists of 5 items with a maximum score of 30 with higher scores indicating better cognitive functioning. Two cut-offs:  ?25/30 has both high sensitivity and specificity, and ?21/30 is highly correlated with diagnosis of dementia. Pt's score was  American Version A  Attention 1/4  Memory 4/7  Fluency 1/7  Visuospatial 0/5  Memory Recall 3/7  TOTAL ACE- III Score 9/30     PATIENT REPORTED OUTCOME MEASURES (PROM): The Myrtis Ser Index of Independence in Activities of Daily Living, commonly referred to as the [place] ADL, is the most appropriate instrument to assess functional status as a measurement of the client's ability to perform activities of daily living independently. Clinicians typically use the tool to detect problems in performing activities of daily living and to plan care accordingly. The Index ranks adequacy of performance  in the six functions of bathing, dressing, toileting, transferring, continence, and feeding. Clients are scored yes/no for independence in each of the six functions.   Scoring Measures: A score of 6 indicates full function, 4 indicates moderate impairment, and 2 or less indicates severe functional impairment.  Bathing:  0-Need help with bathing more than one part of the body, getting in or out of the tub or shower. Requires total bathing  Dressing:  0-Needs help with dressing self or needs to be completely dressed.  Toileting:  0-Needs help transferring to the toilet, cleaning self or uses bedpan or commode.  Transferring:  0-Needs help in moving from bed to chair or requires a complete transfer.  Continence: 0-Is partially or totally incontinent of bowel or bladder Feeding:  1-Gets food from plate into mouth without help. Preparation of food may be done by another person.  Total Points: 1   (6 = High (patient independent) 0 = Low (patient very dependent))     PATIENT EDUCATION: Education details: proposed therapy goals and prognosis. Pt needs to be alert and engaged to gain benefit from ST, caregiver presence is also necessary due to extent of deficits Person educated: Patient and significant other Education method: Explanation Education comprehension: verbalized understanding and needs further education   HOME EXERCISE PROGRAM:   To be developed and provided over the next 1-3 sessions   GOALS:  Goals reviewed with patient? Yes  SHORT TERM GOALS: Target date: 10 sessions  Patient will maintain sustained attention to 3 therapy tasks for at least 5 minutes each with moderate cues. Baseline: 30 seconds Goal status: INITIAL  2.  Patient will complete simple dysarthria home program with usual mod cues. Baseline:  Goal status: INITIAL    LONG TERM GOALS: Target date: 12/02/2022  Patient will sustain attention to a personally relevant task/activity for 8 minutes with no more  than 2 verbal cues. Baseline:  Goal status: INITIAL  2.  Patient will improve intelligibility to 80% at sentence level as judged by familiar listener. Baseline:  Goal status: INITIAL  3.  Patient will complete home program 5/7 days and demonstrate understanding of therapeutic activities to support communication and cognition with caregiver support. Baseline:  Goal status: INITIAL    ASSESSMENT:  CLINICAL IMPRESSION: Patient presents with severe cognitive communication impairments and dysarthria. He kept his eyes closed for most of the evaluation session, but did open them for brief periods up to 2 minutes when prompted to do so. Cues for engagement/arousal necessary every 30 seconds to 2 minutes. Affect is flat, and speech is monotone. Vocal intensity was too low to register on equipment from a distance of 50 cm. Dysarthria is characterized by articulatory imprecision, reduced vocal intensity, and gravelly/hoarse vocal quality. Spouse reports that pt has been successfully communicating wants/needs and pain with her at home, but she states she is hopeful he can improve his vocal quality, loudness and intelligibility. Educated on barriers to progress, and that should pt's arousal/attentiveness interfere with his ability to complete therapy tasks or home activities, will d/c speech therapy and consider reassessment at a later date to determine if spontaneous recovery increases his ability to participate. Of note, patient has had repeat swallow study on 08/18/22: easy to chew solids and thin liquids were recommended, however pt will not accept any diet modifications and continues to consumer regular/thin liquids, per SLP notes. Therefore, skilled intervention targeting dysphagia is deferred at this time. I recommend skilled ST to address cognitive communication impairments and dysarthria to improve intelligibilty, safety, and participation in meaningful activities.  OBJECTIVE IMPAIRMENTS include  attention, memory, awareness, executive functioning, dysarthria, and dysphagia. These impairments are limiting patient from ADLs/IADLs, effectively communicating at home and in community, and safety when swallowing. Factors affecting potential to achieve goals and functional outcome are ability to learn/carryover information, cooperation/participation level, previous level of function, severity of impairments, and motivation . Patient will benefit from skilled SLP services to address above impairments and improve overall function.  REHAB POTENTIAL: Fair ; will initiate ST on trial basis. If pt is unable to maintain sufficient arousal/attention   PLAN: SLP FREQUENCY: 1-2x/week  SLP DURATION: other: 12 weeks but will terminate therapy if pt does not demonstrate adequate engagement, arousal, or attention to therapy tasks  PLANNED INTERVENTIONS: Language facilitation, Environmental controls, Cueing hierachy, Cognitive reorganization, Functional tasks, SLP instruction and feedback, Compensatory strategies, and Patient/family education   Rondel Baton, MS, Corporate investment banker Pathologist [phone]  [facility] [address] [place], [place], [address] Phone: [phone]   Fax:  [phone]

## 2022-09-07 ENCOUNTER — Ambulatory Visit: Payer: 59

## 2022-09-07 ENCOUNTER — Ambulatory Visit: Payer: 59 | Admitting: Speech Pathology

## 2022-09-07 ENCOUNTER — Encounter: Payer: Self-pay | Admitting: Speech Pathology

## 2022-09-07 NOTE — Therapy (Signed)
SPEECH THERAPY DISCHARGE SUMMARY  Visits from Start of Care: 1  Current functional level related to goals / functional outcomes: Goals not met; patient was admitted to hospital for surgery following evaluation and spouse called to cancel remaining appointments.   Remaining deficits: See evaluation 09/03/22 for details   Education / Equipment: none   Patient agrees to discharge. Patient goals were not met. Patient is being discharged due to the patient's request..  Rondel Baton, MS, CCC-SLP Speech-Language Pathologist [phone]

## 2022-09-07 NOTE — Addendum Note (Signed)
Addended by: Presley Raddle on: 09/07/2022 12:51 PM   Modules accepted: Orders

## 2022-09-10 ENCOUNTER — Ambulatory Visit: Payer: 59 | Admitting: Speech Pathology

## 2022-09-10 ENCOUNTER — Ambulatory Visit: Payer: 59

## 2022-09-14 ENCOUNTER — Ambulatory Visit: Payer: 59 | Admitting: Occupational Therapy

## 2022-09-14 ENCOUNTER — Encounter: Payer: 59 | Admitting: Speech Pathology

## 2022-09-17 ENCOUNTER — Encounter: Payer: 59 | Admitting: Speech Pathology

## 2022-09-17 ENCOUNTER — Encounter: Payer: 59 | Admitting: Occupational Therapy

## 2022-09-22 ENCOUNTER — Encounter: Payer: 59 | Admitting: Speech Pathology

## 2022-09-24 ENCOUNTER — Encounter: Payer: 59 | Admitting: Speech Pathology

## 2022-09-29 ENCOUNTER — Encounter: Payer: 59 | Admitting: Speech Pathology

## 2022-09-29 ENCOUNTER — Encounter: Payer: 59 | Admitting: Occupational Therapy

## 2022-10-01 ENCOUNTER — Encounter: Payer: 59 | Admitting: Speech Pathology

## 2022-10-06 ENCOUNTER — Encounter: Payer: 59 | Admitting: Speech Pathology

## 2022-10-13 ENCOUNTER — Encounter: Payer: 59 | Admitting: Speech Pathology

## 2022-10-15 ENCOUNTER — Encounter: Payer: 59 | Admitting: Occupational Therapy

## 2022-10-15 ENCOUNTER — Encounter: Payer: 59 | Admitting: Speech Pathology

## 2022-10-20 ENCOUNTER — Encounter: Payer: 59 | Admitting: Occupational Therapy

## 2022-10-20 ENCOUNTER — Encounter: Payer: 59 | Admitting: Speech Pathology

## 2022-10-22 ENCOUNTER — Encounter: Payer: 59 | Admitting: Occupational Therapy

## 2022-10-22 ENCOUNTER — Encounter: Payer: 59 | Admitting: Speech Pathology

## 2022-10-27 ENCOUNTER — Encounter: Payer: 59 | Admitting: Occupational Therapy

## 2022-10-27 ENCOUNTER — Encounter: Payer: 59 | Admitting: Speech Pathology

## 2022-10-29 ENCOUNTER — Encounter: Payer: 59 | Admitting: Speech Pathology

## 2022-10-29 ENCOUNTER — Encounter: Payer: 59 | Admitting: Occupational Therapy

## 2022-11-02 ENCOUNTER — Ambulatory Visit: Payer: 59 | Attending: Neurosurgery | Admitting: Speech Pathology

## 2022-11-03 ENCOUNTER — Encounter: Payer: 59 | Admitting: Speech Pathology

## 2022-11-03 ENCOUNTER — Encounter: Payer: 59 | Admitting: Occupational Therapy

## 2022-11-04 ENCOUNTER — Encounter: Payer: 59 | Admitting: Speech Pathology

## 2022-11-05 ENCOUNTER — Encounter: Payer: 59 | Admitting: Occupational Therapy

## 2022-11-05 ENCOUNTER — Encounter: Payer: 59 | Admitting: Speech Pathology

## 2022-11-09 ENCOUNTER — Ambulatory Visit: Payer: 59 | Admitting: Speech Pathology

## 2022-11-10 ENCOUNTER — Encounter: Payer: 59 | Admitting: Speech Pathology

## 2022-11-10 ENCOUNTER — Encounter: Payer: 59 | Admitting: Occupational Therapy

## 2022-11-12 ENCOUNTER — Encounter: Payer: 59 | Admitting: Speech Pathology

## 2022-11-12 ENCOUNTER — Encounter: Payer: 59 | Admitting: Occupational Therapy

## 2022-11-12 ENCOUNTER — Ambulatory Visit: Payer: 59 | Admitting: Speech Pathology

## 2022-11-17 ENCOUNTER — Encounter: Payer: 59 | Admitting: Speech Pathology

## 2022-11-24 ENCOUNTER — Encounter: Payer: 59 | Admitting: Speech Pathology

## 2022-11-26 ENCOUNTER — Encounter: Payer: 59 | Admitting: Occupational Therapy

## 2022-11-26 ENCOUNTER — Encounter: Payer: 59 | Admitting: Speech Pathology

## 2022-11-30 ENCOUNTER — Encounter: Payer: 59 | Admitting: Speech Pathology

## 2022-12-01 ENCOUNTER — Encounter: Payer: 59 | Admitting: Speech Pathology

## 2022-12-01 ENCOUNTER — Encounter: Payer: 59 | Admitting: Occupational Therapy

## 2022-12-03 ENCOUNTER — Encounter: Payer: 59 | Admitting: Occupational Therapy

## 2022-12-03 ENCOUNTER — Encounter: Payer: 59 | Admitting: Speech Pathology

## 2022-12-07 ENCOUNTER — Encounter: Payer: 59 | Admitting: Speech Pathology

## 2022-12-08 ENCOUNTER — Encounter: Payer: 59 | Admitting: Occupational Therapy

## 2022-12-08 ENCOUNTER — Encounter: Payer: 59 | Admitting: Speech Pathology

## 2022-12-10 ENCOUNTER — Encounter: Payer: 59 | Admitting: Speech Pathology

## 2022-12-10 ENCOUNTER — Encounter: Payer: 59 | Admitting: Occupational Therapy

## 2022-12-15 ENCOUNTER — Encounter: Payer: 59 | Admitting: Speech Pathology

## 2022-12-15 ENCOUNTER — Encounter: Payer: 59 | Admitting: Occupational Therapy

## 2022-12-17 ENCOUNTER — Encounter: Payer: 59 | Admitting: Occupational Therapy

## 2022-12-17 ENCOUNTER — Encounter: Payer: 59 | Admitting: Speech Pathology

## 2023-02-16 DEATH — deceased
